# Patient Record
Sex: Female | Born: 1950 | Race: White | Hispanic: No | Marital: Married | Smoking: Never smoker
Health system: Southern US, Community
[De-identification: ages and names within clinical notes are randomized; demographics above are authoritative.]

## PROBLEM LIST (undated history)

## (undated) DIAGNOSIS — E7800 Pure hypercholesterolemia, unspecified: Secondary | ICD-10-CM

## (undated) DIAGNOSIS — I4892 Unspecified atrial flutter: Secondary | ICD-10-CM

## (undated) DIAGNOSIS — R0789 Other chest pain: Secondary | ICD-10-CM

## (undated) DIAGNOSIS — E785 Hyperlipidemia, unspecified: Secondary | ICD-10-CM

## (undated) DIAGNOSIS — R112 Nausea with vomiting, unspecified: Secondary | ICD-10-CM

## (undated) DIAGNOSIS — Z9889 Other specified postprocedural states: Secondary | ICD-10-CM

## (undated) HISTORY — PX: BREAST SURGERY: SHX581

## (undated) HISTORY — PX: TONSILLECTOMY: SUR1361

## (undated) HISTORY — DX: Other chest pain: R07.89

## (undated) HISTORY — PX: BREAST CYST EXCISION: SHX579

## (undated) HISTORY — PX: BREAST BIOPSY: SHX20

## (undated) HISTORY — DX: Unspecified atrial flutter: I48.92

## (undated) HISTORY — PX: ABDOMINAL HYSTERECTOMY: SHX81

## (undated) HISTORY — PX: CHOLECYSTECTOMY: SHX55

## (undated) HISTORY — DX: Hyperlipidemia, unspecified: E78.5

---

## 2000-09-14 ENCOUNTER — Other Ambulatory Visit: Admission: RE | Admit: 2000-09-14 | Discharge: 2000-09-14 | Payer: Self-pay | Admitting: *Deleted

## 2001-08-25 ENCOUNTER — Ambulatory Visit (HOSPITAL_COMMUNITY): Admission: RE | Admit: 2001-08-25 | Discharge: 2001-08-25 | Payer: Self-pay | Admitting: *Deleted

## 2001-08-25 ENCOUNTER — Encounter: Payer: Self-pay | Admitting: *Deleted

## 2003-07-19 ENCOUNTER — Ambulatory Visit (HOSPITAL_COMMUNITY): Admission: RE | Admit: 2003-07-19 | Discharge: 2003-07-19 | Payer: Self-pay | Admitting: General Surgery

## 2004-09-03 ENCOUNTER — Ambulatory Visit (HOSPITAL_COMMUNITY): Admission: RE | Admit: 2004-09-03 | Discharge: 2004-09-03 | Payer: Self-pay | Admitting: *Deleted

## 2005-06-23 ENCOUNTER — Observation Stay (HOSPITAL_COMMUNITY): Admission: EM | Admit: 2005-06-23 | Discharge: 2005-06-24 | Payer: Self-pay | Admitting: Emergency Medicine

## 2005-11-10 ENCOUNTER — Ambulatory Visit (HOSPITAL_COMMUNITY): Admission: RE | Admit: 2005-11-10 | Discharge: 2005-11-10 | Payer: Self-pay | Admitting: General Surgery

## 2006-06-30 ENCOUNTER — Ambulatory Visit: Payer: Self-pay | Admitting: Internal Medicine

## 2006-07-06 ENCOUNTER — Ambulatory Visit (HOSPITAL_COMMUNITY): Admission: RE | Admit: 2006-07-06 | Discharge: 2006-07-06 | Payer: Self-pay | Admitting: Internal Medicine

## 2006-07-06 ENCOUNTER — Encounter (INDEPENDENT_AMBULATORY_CARE_PROVIDER_SITE_OTHER): Payer: Self-pay | Admitting: *Deleted

## 2006-07-06 ENCOUNTER — Ambulatory Visit: Payer: Self-pay | Admitting: Internal Medicine

## 2007-05-06 ENCOUNTER — Other Ambulatory Visit: Admission: RE | Admit: 2007-05-06 | Discharge: 2007-05-06 | Payer: Self-pay | Admitting: Obstetrics and Gynecology

## 2007-10-14 ENCOUNTER — Ambulatory Visit (HOSPITAL_COMMUNITY): Admission: RE | Admit: 2007-10-14 | Discharge: 2007-10-14 | Payer: Self-pay | Admitting: Family Medicine

## 2007-10-18 ENCOUNTER — Ambulatory Visit (HOSPITAL_COMMUNITY): Admission: RE | Admit: 2007-10-18 | Discharge: 2007-10-18 | Payer: Self-pay | Admitting: Family Medicine

## 2009-01-29 ENCOUNTER — Ambulatory Visit (HOSPITAL_COMMUNITY): Admission: RE | Admit: 2009-01-29 | Discharge: 2009-01-29 | Payer: Self-pay | Admitting: General Surgery

## 2009-02-14 ENCOUNTER — Other Ambulatory Visit: Admission: RE | Admit: 2009-02-14 | Discharge: 2009-02-14 | Payer: Self-pay | Admitting: Obstetrics and Gynecology

## 2009-03-08 ENCOUNTER — Emergency Department (HOSPITAL_COMMUNITY): Admission: EM | Admit: 2009-03-08 | Discharge: 2009-03-08 | Payer: Self-pay | Admitting: Emergency Medicine

## 2009-06-19 ENCOUNTER — Ambulatory Visit (HOSPITAL_COMMUNITY): Admission: RE | Admit: 2009-06-19 | Discharge: 2009-06-19 | Payer: Self-pay | Admitting: Family Medicine

## 2010-06-07 ENCOUNTER — Emergency Department (HOSPITAL_COMMUNITY)
Admission: EM | Admit: 2010-06-07 | Discharge: 2010-06-07 | Disposition: A | Discharge status: Home / Self Care | Payer: BC Managed Care – PPO | Attending: Emergency Medicine | Admitting: Emergency Medicine

## 2010-06-07 DIAGNOSIS — Z859 Personal history of malignant neoplasm, unspecified: Secondary | ICD-10-CM | POA: Insufficient documentation

## 2010-06-07 DIAGNOSIS — R1013 Epigastric pain: Secondary | ICD-10-CM | POA: Insufficient documentation

## 2010-06-07 DIAGNOSIS — R000 Tachycardia, unspecified: Secondary | ICD-10-CM | POA: Insufficient documentation

## 2010-06-07 DIAGNOSIS — R112 Nausea with vomiting, unspecified: Secondary | ICD-10-CM | POA: Insufficient documentation

## 2010-06-07 DIAGNOSIS — R10816 Epigastric abdominal tenderness: Secondary | ICD-10-CM | POA: Insufficient documentation

## 2010-06-07 LAB — URINALYSIS, ROUTINE W REFLEX MICROSCOPIC
Glucose, UA: NEGATIVE mg/dL
Hgb urine dipstick: NEGATIVE
Protein, ur: NEGATIVE mg/dL
pH: 6 (ref 5.0–8.0)

## 2010-06-08 ENCOUNTER — Emergency Department (HOSPITAL_COMMUNITY): Payer: BC Managed Care – PPO

## 2010-06-08 ENCOUNTER — Inpatient Hospital Stay (HOSPITAL_COMMUNITY)
Admission: EM | Admit: 2010-06-08 | Discharge: 2010-06-12 | DRG: 494 | Disposition: A | Discharge status: Home Health | Payer: BC Managed Care – PPO | Attending: General Surgery | Admitting: General Surgery

## 2010-06-08 DIAGNOSIS — K8065 Calculus of gallbladder and bile duct with chronic cholecystitis with obstruction: Principal | ICD-10-CM | POA: Diagnosis present

## 2010-06-08 DIAGNOSIS — K8061 Calculus of gallbladder and bile duct with cholecystitis, unspecified, with obstruction: Principal | ICD-10-CM | POA: Diagnosis present

## 2010-06-08 LAB — DIFFERENTIAL
Lymphocytes Relative: 25 % (ref 12–46)
Lymphs Abs: 1.8 10*3/uL (ref 0.7–4.0)
Monocytes Relative: 9 % (ref 3–12)
Neutro Abs: 4.7 10*3/uL (ref 1.7–7.7)
Neutrophils Relative %: 65 % (ref 43–77)

## 2010-06-08 LAB — COMPREHENSIVE METABOLIC PANEL
Albumin: 3.8 g/dL (ref 3.5–5.2)
Alkaline Phosphatase: 147 U/L — ABNORMAL HIGH (ref 39–117)
BUN: 17 mg/dL (ref 6–23)
CO2: 28 mEq/L (ref 19–32)
Chloride: 105 mEq/L (ref 96–112)
Creatinine, Ser: 0.84 mg/dL (ref 0.4–1.2)
GFR calc non Af Amer: >60 mL/min (ref >60)
Potassium: 3.7 mEq/L (ref 3.5–5.1)
Total Bilirubin: 2.1 mg/dL — ABNORMAL HIGH (ref 0.3–1.2)

## 2010-06-08 LAB — CBC
HCT: 40.7 % (ref 36.0–46.0)
Hemoglobin: 13.5 g/dL (ref 12.0–15.0)
MCH: 29.8 pg (ref 26.0–34.0)
MCV: 89.8 fL (ref 78.0–100.0)
Platelets: 271 10*3/uL (ref 150–400)
RBC: 4.53 MIL/uL (ref 3.87–5.11)
WBC: 7.3 10*3/uL (ref 4.0–10.5)

## 2010-06-08 MED ORDER — IOHEXOL 300 MG/ML  SOLN
100.0000 mL | Freq: Once | INTRAMUSCULAR | Status: AC | PRN
  Administered 2010-06-08: 100 mL via INTRAVENOUS

## 2010-06-09 ENCOUNTER — Inpatient Hospital Stay (HOSPITAL_COMMUNITY): Payer: BC Managed Care – PPO

## 2010-06-09 DIAGNOSIS — K8040 Calculus of bile duct with cholecystitis, unspecified, without obstruction: Secondary | ICD-10-CM

## 2010-06-09 LAB — DIFFERENTIAL
Basophils Absolute: 0 10*3/uL (ref 0.0–0.1)
Basophils Relative: 1 % (ref 0–1)
Eosinophils Absolute: 0.1 10*3/uL (ref 0.0–0.7)
Monocytes Absolute: 0.5 10*3/uL (ref 0.1–1.0)
Monocytes Relative: 9 % (ref 3–12)
Neutro Abs: 2.9 10*3/uL (ref 1.7–7.7)
Neutrophils Relative %: 58 % (ref 43–77)

## 2010-06-09 LAB — CBC
MCH: 30.1 pg (ref 26.0–34.0)
MCHC: 33.4 g/dL (ref 30.0–36.0)
Platelets: 228 10*3/uL (ref 150–400)
RBC: 3.99 MIL/uL (ref 3.87–5.11)

## 2010-06-09 LAB — COMPREHENSIVE METABOLIC PANEL
ALT: 520 U/L — ABNORMAL HIGH (ref 0–35)
AST: 465 U/L — ABNORMAL HIGH (ref 0–37)
Albumin: 2.9 g/dL — ABNORMAL LOW (ref 3.5–5.2)
Calcium: 8.2 mg/dL — ABNORMAL LOW (ref 8.4–10.5)
Creatinine, Ser: 0.65 mg/dL (ref 0.4–1.2)
GFR calc Af Amer: >60 mL/min (ref >60)
Sodium: 137 mEq/L (ref 135–145)

## 2010-06-10 ENCOUNTER — Inpatient Hospital Stay (HOSPITAL_COMMUNITY): Payer: BC Managed Care – PPO

## 2010-06-10 DIAGNOSIS — K8000 Calculus of gallbladder with acute cholecystitis without obstruction: Secondary | ICD-10-CM

## 2010-06-10 LAB — HEPATIC FUNCTION PANEL
ALT: 820 U/L — ABNORMAL HIGH (ref 0–35)
AST: 504 U/L — ABNORMAL HIGH (ref 0–37)
Albumin: 3.5 g/dL (ref 3.5–5.2)
Alkaline Phosphatase: 202 U/L — ABNORMAL HIGH (ref 39–117)
Total Bilirubin: 1.7 mg/dL — ABNORMAL HIGH (ref 0.3–1.2)

## 2010-06-10 NOTE — H&P (Signed)
NAME:  Ruth Mendoza, Ruth Mendoza              ACCOUNT NO.:  [id number]  MEDICAL RECORD NO.:  [id number]           PATIENT TYPE:  I  LOCATION:  A306                          FACILITY:  [facility]  PHYSICIAN:  R. Roetta Sessions, M.D. DATE OF BIRTH:  December 25, 1950  DATE OF ADMISSION:  06/08/2010 DATE OF DISCHARGE:  LH                             HISTORY & PHYSICAL   REASON FOR CONSULTATION:  Cholecystitis with dilated biliary tree and markedly elevated bilirubin and aminotransferases.  HISTORY OF PRESENT ILLNESS:  [name] is a very pleasant 60- year-old lady who developed acute onset of intermittent nausea, vomiting, epigastric right upper quadrant abdominal pain 3 days ago. She presented to emergency department and was told she had a viral syndrome.  She was sent home.  Symptoms recurred and she returned and subsequently admitted to the hospital yesterday.  Evaluation upon admission demonstrated elevated LFTs with total bilirubin 2.1, fractionated alkaline phosphatase 147, AST and ALT 279 and 534.  Lipase normal at 45.  CBC revealed white count of 7.3, H and H 13.5 and 40.7.  She has been admitted to Dr. Daisy Blossom service.  Imaging has included a CT of the abdomen and pelvis as well as an ultrasound.  On CT, she has a mildly dilated intrahepatic and extrahepatic biliary tree with mild gallbladder wall thickening.  This was followed up with a ultrasound which demonstrated thickening of gallbladder wall, mild dilation of the bile duct and numerous small mobile gallstones.  No obvious choledocholithiasis on CT.  She has been treated with IV Rocephin. Today her LFTs are somewhat more elevated with a total bilirubin 3.5, alkaline phosphatase of 137, AST and ALT 465 and 520 respectively.  Albumin 2.9.  Today her abdominal pain is much improved.  She has not had any dark colored or clay colored stools.  She denies any similar symptoms previously.  No history of elevated liver  enzymes previously.  PAST MEDICAL HISTORY:  Significant for diarrhea, predominant irritable bowel syndrome.  She has seen Dr. Karilyn Cota previously, has been managed with Librax very nicely in the past.  Currently those chronic symptoms are quiescent.  She is no longer taking Librax.  She underwent screening colonoscopy by Dr. Karilyn Cota back in 2008.  She had some tiny erosions with histology revealing mild acute colitis and external hemorrhoids only.  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  History of multiple fiber cyst in both breasts removed by Dr. Malvin Johns previously.  CURRENT MEDICATIONS:  None.  ALLERGIES:  Multiple and she has intolerances to CODEINE and CONSCIOUS SEDATION with nausea and vomiting.  FAMILY HISTORY:  Negative for chronic GI or liver illness.  SOCIAL HISTORY:  The patient is married, has 3 children.  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 Rarely consumes alcohol.  No tobacco, no illicit drugs.  REVIEW OF SYSTEMS:  No doubt the patient has early satiety.  No melena, rectal bleeding, or change in weight recently.  No fever, chills. Otherwise as in history of present illness.  PHYSICAL EXAMINATION:  GENERAL:  A very pleasant 59-year lady resting comfortably in hospital room 306 accompanied by her husband Ruth Mendoza. VITAL SIGNS:  Temperature 97.8, pulse 69, respiratory rate 18, BP 97/57. SKIN:  Warm and dry.  There is no jaundice or cutaneous stigmata of chronic liver disease.  No scleral icterus. CHEST:  Lungs are clear to auscultation. HEART:  Regular rate and rhythm without murmur, gallop, or rub. BREASTS:  Deferred. ABDOMEN:  Nondistended.  Positive bowel sounds, minimal right upper quadrant tenderness to palpation.  No appreciable mass or organomegaly. EXTREMITIES:  No edema.  ADDITIONAL LABORATORY DATA:  Sodium 137,  potassium 3.5, chloride 107, CO2 23, glucose 93, BUN 10, creatinine 0.65.  CBC today, white count 5.1, H and H 12.0 and 35.9, MCV 90, platelet count 228,000.  IMPRESSION:  [name] is a very pleasant 60 year old lady admitted to hospital with acute cholecystitis along with biochemical and radiographic imaging consistent with partial biliary obstruction, likely secondary to choledocholithiasis.  Has no evidence of __________ pancreatitis, or cholangitis at this time.  Not mentioned above, she has been started on Rocephin.  I have discussed the case with Dr. Malvin Johns at length.  He plans to take her gallbladder out later in the week once her bile duct is cleared.  RECOMMENDATIONS:  Diagnostic/therapeutic ERCP likely with sphincterotomy and stone extraction tomorrow.  Discussed risks, benefits, limitations, alternatives, and imponderables with Ruth Mendoza.  Reviewed 1 in 10 Mendoza of pancreatitis, potential for reaction to medications, bleeding, and perforation.  All questions have been answered and all parties agreeable.  I will make further recommendations once ERCP has been performed.  We will arrange for tomorrow's dose of Rocephin given prior to the procedure.  I would thank Dr. Barbaraann Barthel for allowing to me to assist in this nice lady today.     Jonathon Bellows, M.D.     RMR/MEDQ  D:  06/09/2010  T:  06/09/2010  Job:  [id number]  cc:   Barbaraann Barthel, M.D. Fax: [phone]  Electronically Signed by Lorrin Goodell M.D. on 06/10/2010 10:31:16 AM

## 2010-06-11 ENCOUNTER — Other Ambulatory Visit: Payer: Self-pay | Admitting: General Surgery

## 2010-06-11 ENCOUNTER — Encounter (INDEPENDENT_AMBULATORY_CARE_PROVIDER_SITE_OTHER): Payer: Self-pay | Admitting: *Deleted

## 2010-06-11 LAB — HEPATIC FUNCTION PANEL
ALT: 488 U/L — ABNORMAL HIGH (ref 0–35)
Alkaline Phosphatase: 154 U/L — ABNORMAL HIGH (ref 39–117)
Bilirubin, Direct: 0.4 mg/dL — ABNORMAL HIGH (ref 0.0–0.3)
Indirect Bilirubin: 0.7 mg/dL (ref 0.3–0.9)
Total Bilirubin: 1.1 mg/dL (ref 0.3–1.2)
Total Protein: 5.6 g/dL — ABNORMAL LOW (ref 6.0–8.3)

## 2010-06-12 LAB — HEPATIC FUNCTION PANEL
ALT: 308 U/L — ABNORMAL HIGH (ref 0–35)
AST: 73 U/L — ABNORMAL HIGH (ref 0–37)
Albumin: 2.9 g/dL — ABNORMAL LOW (ref 3.5–5.2)
Alkaline Phosphatase: 125 U/L — ABNORMAL HIGH (ref 39–117)
Total Protein: 5.3 g/dL — ABNORMAL LOW (ref 6.0–8.3)

## 2010-06-12 LAB — DIFFERENTIAL
Basophils Absolute: 0 10*3/uL (ref 0.0–0.1)
Eosinophils Absolute: 0 10*3/uL (ref 0.0–0.7)
Eosinophils Relative: 1 % (ref 0–5)
Lymphocytes Relative: 26 % (ref 12–46)
Monocytes Absolute: 0.6 10*3/uL (ref 0.1–1.0)

## 2010-06-12 LAB — BASIC METABOLIC PANEL
Chloride: 110 mEq/L (ref 96–112)
GFR calc Af Amer: >60 mL/min (ref >60)
GFR calc non Af Amer: >60 mL/min (ref >60)
Potassium: 3.7 mEq/L (ref 3.5–5.1)
Sodium: 143 mEq/L (ref 135–145)

## 2010-06-12 LAB — CBC
HCT: 38.1 % (ref 36.0–46.0)
MCHC: 32.5 g/dL (ref 30.0–36.0)
RDW: 13.7 % (ref 11.5–15.5)

## 2010-06-16 NOTE — Op Note (Signed)
NAME:  Ruth Mendoza, Ruth Mendoza              ACCOUNT NO.:  [id number]  MEDICAL RECORD NO.:  [id number]           PATIENT TYPE:  I  LOCATION:  A306                          FACILITY:  [facility]  PHYSICIAN:  R. Roetta Sessions, M.D. DATE OF BIRTH:  07-Nov-1950  DATE OF PROCEDURE:  06/10/2010 DATE OF DISCHARGE:                              OPERATIVE REPORT   PROCEDURE PERFORMED:  ERCP with sphincterotomy stone (gravel extraction) via balloon dredging.  INDICATIONS FOR PROCEDURE:  A 60 year old lady admitted to hospital with acute cholecystitis, markedly elevated liver enzymes, mild dilation of her entire biliary tree high suggestive of choledocholithiasis.  She has been admitted to Dr. Malvin Johns.  Plans are for have laparoscopic cholecystectomy tomorrow.  I saw her over the weekend.  An ERCP now being done to clear her bile duct prior to laparoscopic cholecystectomy. I had a long discussion with the patient, and the patient's husband yesterday regarding this approach.  Risks, benefits, limitations, alternatives, imponderables have been discussed with Dr. Malvin Johns about potential for reaction to medications, bleeding, perforation, and at least 1 in 10 chance of pancreatitis.  All questions have been answered and all parties agreeable.  PROCEDURE NOTE:  She was placed under general endotracheal anesthesia by Dr. [facility].  She was put in the semiprone position. She received were Rocephin 1 g IV prior to the procedure.  Cetacaine spray for topical pharyngeal anesthesia.  INSTRUMENT:  Pentax video chip system.  FINDINGS:  The duodenoscope was easily passed through the esophagus, stomach, and into the second portion of the duodenum.  The distal esophagus across the examination of the stomach and duodenum revealed no abnormalities.  The ampulla of Vater was readily identified on the medial wall of the second portion of duodenum.  The scope was pulled back to the short position 55 cm from  incised scout film was taken.  Using the micro side of a sphincterotome, the ampulla was impacted. Using a guidewire, palpation almost immediately was achieved deep biliary cannulation with a wire.  The sphincterotome was followed up. Cholangiogram was obtained.  The extra and intra hepatic biliary tree was filled out nicely with the contrast as did the gallbladder. Gallbladder was loaded with stones and common bile duct was not dilated. I was unable to identify any filling defects.  Keeping safety wire deep in the biliary tree, the sphincter was pulled back across the ampullary orifice in the 12 o'clock position 1 cm sphincterotomy was performed, this was done without difficulty under barrier without apparent complication.  The sphincter tone was railed off keeping the safety wire in stable position.  Essentially gradually the balloon was railed to the confluence and partially inflated, a size bile duct and subsequently inclusion cholangiogram was obtained with this maneuver and with sphincterotomy was noted that there was quite a bit of dark viscous bilious fluid and some small dark specks and some tiny pieces of stone debris delivered through the ampullary orifice, but no larger stones were delivered.  There was excellent flow of amber colored clear fluid after this maneuver and there was excellent drainage seen of the biliary tree fluoroscopically.  The pancreatic  duct was not manipulated or injected.  The patient tolerated the procedure well and was taken PACU in stable condition.  IMPRESSION: 1. Normal ampulla. 2. Cholelithiasis, bile duct not dilated, status post sphincterotomy,     balloon dredging of the bile duct with recovery of viscous bile and     stone fragments (gravel) excellent drainage of the biliary tree     following this maneuver. 3. Pancreatic duct not injected or otherwise manipulated.  RECOMMENDATIONS: 1. Clear liquid diet today past midnight for  laparoscopic     cholecystectomy tomorrow per Dr. Malvin Johns. 2. Check hepatic function profile tomorrow morning. 3. Continue antibiotics.  I have discussed my findings with Dr. Malvin Johns, the patient's daughter, and called to the patient's husband at [phone] and reviewed the procedure findings and intervention.     Jonathon Bellows, M.D.     RMR/MEDQ  D:  06/10/2010  T:  06/10/2010  Job:  [id number]  cc:   Barbaraann Barthel, M.D. Fax: [phone]  [facility]  Electronically Signed by Lorrin Goodell M.D. on 06/16/2010 09:12:45 AM

## 2010-06-18 NOTE — Miscellaneous (Signed)
Summary: HISTORY & PHYSICAL  Clinical Lists Changes  NAME:  Ruth, Mendoza              ACCOUNT NO.:  [id number]      MEDICAL RECORD NO.:  [id number]           PATIENT TYPE:  I      LOCATION:  A306                          FACILITY:  [facility]      PHYSICIAN:  R. Roetta Sessions, M.D. DATE OF BIRTH:  1950-07-30      DATE OF ADMISSION:  06/08/2010   DATE OF DISCHARGE:  LH                                 HISTORY          REASON FOR CONSULTATION:  Cholecystitis with dilated biliary tree and   markedly elevated bilirubin and aminotransferases.      HISTORY OF PRESENT ILLNESS:  Ruth Mendoza is a very pleasant 7-   year-old lady who developed acute onset of intermittent nausea,   vomiting, epigastric right upper quadrant abdominal pain 3 days ago.   She presented to emergency department and was told she had a viral   syndrome.  She was sent home.  Symptoms recurred and she returned and   subsequently admitted to the hospital yesterday.      Evaluation upon admission demonstrated elevated LFTs with total   bilirubin 2.1, fractionated alkaline phosphatase 147, AST and ALT 279   and 534.  Lipase normal at 45.  CBC revealed white count of 7.3, H and H   13.5 and 40.7.      She has been admitted to Dr. Daisy Blossom service.      Imaging has included a CT of the abdomen and pelvis as well as an   ultrasound.  On CT, she has a mildly dilated intrahepatic and   extrahepatic biliary tree with mild gallbladder wall thickening.  This   was followed up with a ultrasound which demonstrated thickening of   gallbladder wall, mild dilation of the bile duct and numerous small   mobile gallstones.  No obvious choledocholithiasis on CT.  She has been   treated with IV Rocephin. Today her LFTs are somewhat more elevated with   a total bilirubin 3.5, alkaline phosphatase of 137, AST and ALT 465 and   520 respectively.  Albumin 2.9.  Today her abdominal pain is much   improved.      She has not had any  dark colored or clay colored stools.  She denies any   similar symptoms previously.  No history of elevated liver enzymes   previously.      PAST MEDICAL HISTORY:  Significant for diarrhea, predominant irritable   bowel syndrome.  She has seen Dr. Karilyn Cota previously, has been managed   with Librax very nicely in the past.  Currently those chronic symptoms   are quiescent.  She is no longer taking Librax.      She underwent screening colonoscopy by Dr. Karilyn Cota back in 2008.  She had   some tiny erosions with histology revealing mild acute colitis and   external hemorrhoids only.  History of D   grade dysplasia.      History of multiple fiber cyst in both breasts removed by Dr. Malvin Johns  previously.      CURRENT MEDICATIONS:  None.      ALLERGIES:  Multiple and she has intolerances to CODEINE and CONSCIOUS   SEDATION with nausea and vomiting.      FAMILY HISTORY:  Negative for chronic GI or liver illness.      SOCIAL HISTORY:  The patient is married, has 3 children.  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   Rarely consumes alcohol.  No tobacco, no illicit drugs.      REVIEW OF SYSTEMS:  No doubt the patient has early satiety.  No melena,   rectal bleeding, or change in weight recently.  No fever, chills.   Otherwise as in history of present illness.      PHYSICAL EXAMINATION:  GENERAL:  A very pleasant 59-year lady resting   comfortably in hospital room 306 accompanied by her husband Loistine Chance.   VITAL SIGNS:  Temperature 97.8, pulse 69, respiratory rate 18, BP 97/57.   SKIN:  Warm and dry.  There is no jaundice or cutaneous stigmata of   chronic liver disease.  No scleral icterus.   CHEST:  Lungs are clear to auscultation.   HEART:  Regular rate and rhythm without murmur, gallop, or rub.   BREASTS:  Deferred.   ABDOMEN:  Nondistended.  Positive bowel sounds,  minimal right upper   quadrant tenderness to palpation.  No appreciable mass or organomegaly.   EXTREMITIES:  No edema.      ADDITIONAL LABORATORY DATA:  Sodium 137, potassium 3.5, chloride 107,   CO2 23, glucose 93, BUN 10, creatinine 0.65.  CBC today, white count   5.1, H and H 12.0 and 35.9, MCV 90, platelet count 228,000.      IMPRESSION:  Ruth Mendoza is a very pleasant 60 year old lady   admitted to hospital with acute cholecystitis along with biochemical and   radiographic imaging consistent with partial biliary obstruction, likely   secondary to choledocholithiasis.  Has no evidence of __________   pancreatitis, or cholangitis at this time.  Not mentioned above, she has   been started on Rocephin.      I have discussed the case with Dr. Malvin Johns at length.      He plans to take her gallbladder out later in the week once her bile   duct is cleared.      RECOMMENDATIONS:  Diagnostic/therapeutic ERCP likely with sphincterotomy   and stone extraction tomorrow.  Discussed risks, benefits, limitations,   alternatives, and imponderables with Mr. and Ms. Freeman.  Reviewed 1 in   10 chance of pancreatitis, potential for reaction to medications,   bleeding, and perforation.  All questions have been   answered and all parties agreeable.  I will make further recommendations   once ERCP has been performed.  We will arrange for tomorrow's dose of   Rocephin given prior to the procedure.      I would thank Dr. Barbaraann Barthel for allowing to me to assist in this   nice lady today.               Jonathon Bellows, M.D.               RMR/MEDQ  D:  06/09/2010  T:  06/09/2010  Job:  [id number]      cc:   Barbaraann Barthel, M.D.   Fax: [phone]  Electronically Signed by Lorrin Goodell M.D. on 06/10/2010 10:31:16 AM

## 2010-06-24 NOTE — Discharge Summary (Signed)
NAME:  Ruth Mendoza, EGLE NO.:  [id number]  MEDICAL RECORD NO.:  [id number]           PATIENT TYPE:  I  LOCATION:  A306                          FACILITY:  [facility]  PHYSICIAN:  Barbaraann Barthel, M.D. DATE OF BIRTH:  July 28, 1950  DATE OF ADMISSION:  06/08/2010 DATE OF DISCHARGE:  03/14/2012LH                              DISCHARGE SUMMARY   DIAGNOSES:  Cholecystitis, cholelithiasis, choledocholithiasis.  CONSULTATIONS:  GI Service (Dr. Jena Gauss).  PROCEDURE:  On June 10, 2010, ERCP by Dr. Jena Gauss on June 11, 2010, laparoscopic cholecystectomy, Dr. Malvin Johns.  NOTE:  This is a 60 year old white female who had her first episode of biliary colic.  She was seen in the emergency room and later returned when her symptoms recurred and workup subsequently showed her to have cholecystitis with cholelithiasis and questionable choledocholithiasis. She had elevated liver function studies and bilirubin suggesting common bile duct stone.  I consulted Dr. Jena Gauss, who agreed with this and proceeded with ERCP which was uneventful.  She underwent an ERCP and a sphincterotomy and at that time on June 11, 2010, and then on the following 24 hours, performed laparoscopic cholecystectomy uneventfully. Postoperatively, her course was completely smooth.  She had minimal JP drainage.  The following day, she was tolerating p.o. well.  She was afebrile.  She had no dysuria, leg pain, or shortness of breath.  Her wounds were clean and her bilirubin was normal and her liver function studies were also on a steady downward trend and almost within the normal range.  Postoperatively, there were no problems.  She was then discharged after removing her Jackson-Pratt drain and changing her dressings and discharge instructions were given to her. Her laboratory data while in the hospital is stated, she had a CT scan which showed cholecystitis and dilated biliary radicals followed by sonogram which confirmed  cholelithiasis and her liver function studies on admission were quite elevated.  She had a bilirubin at 2.1, this later went to 3.5 and AST of 279, which later went to 465, ALT 534 which remained in the 500 range as well lipase, however was not elevated.  On admission, her white count was 5.1 with an H and H of 12.0 and 35.9. Postoperatively, her liver function studies all went down, bilirubin after ERCP was 1.1, and it continued to downward trend as all of her liver function studies did.  DISCHARGE INSTRUCTIONS:  She is discharged on Tylenol Extra Strength for pain.  She has great intolerance to any CODEINE as they make her very nauseated. Activity, she is told to increase it as tolerated.  She is permitted to go up and down the stairs.  She is admitted to shower.  She is told to do no heavy lifting, no driving and no sex and until she feels better and we will discuss that with her.  I will follow up with her perioperatively.  She is discharged on a full liquid and soft diet, told to clean her wound with alcohol a couple of times a day as needed and to change her drain site as well as needed.  She is told to follow up with  me on Wednesday, June 19, 2010, at 10 a.m., and she is given my phone number to call should she have any acute problems and she is told to go to the emergency room, she can reach me if she has any problems.     Barbaraann Barthel, M.D.     WB/MEDQ  D:  06/12/2010  T:  06/12/2010  Job:  [id number]  cc:   R. Roetta Sessions, M.D. [address] [place] [address]  Dr. Phillips Odor  Electronically Signed by Barbaraann Barthel M.D. on 06/24/2010 11:42:08 AM

## 2010-06-24 NOTE — Op Note (Signed)
NAME:  Ruth Mendoza, Ruth Mendoza NO.:  [id number]  MEDICAL RECORD NO.:  [id number]           PATIENT TYPE:  I  LOCATION:  A306                          FACILITY:  [facility]  PHYSICIAN:  Barbaraann Barthel, M.D. DATE OF BIRTH:  22-Oct-1950  DATE OF PROCEDURE:  06/11/2010 DATE OF DISCHARGE:                              OPERATIVE REPORT   DIAGNOSES:  Cholecystitis, cholelithiasis, choledocholithiasis, status post endoscopic retrograde cholangiopancreatography.  PROCEDURE:  Laparoscopic cholecystectomy.  No cholangiogram.  WOUND CLASSIFICATION:  Clean and contaminated.  SPECIMEN:  Gallbladder and stones.  NOTE:  This is a 60 year old white female who had her first episode of gallbladder symptoms and was seen in the emergency room, initially thought to have a viral syndrome and later returned when she was found to have cholecystitis and cholelithiasis and dilated biliary radicals and elevated liver function studies.  This suggested the possibility choledocholithiasis in this patient and an ERCP was performed after a GI consultation.  This was uneventful and 24 hours later she was scheduled for a laparoscopic cholecystectomy.  We discussed complications not limited to but including bleeding, infection, damage to bile ducts, perforation of organs, transitory diarrhea, and the possibility that an open cholecystectomy might be performed.  Informed consent was obtained.  GROSS OPERATIVE FINDINGS:  Residual edema of the gallbladder.  She had a small cystic duct which was not cannulated.  She had BB-sized stones within the gallbladder, otherwise the right upper quadrant was grossly within normal limits.  TECHNIQUE:  The patient was placed in supine position.  After the adequate administration of general anesthesia via endotracheal intubation, her entire abdomen was prepped with Betadine solution and draped in the usual manner.  Prior to this, a Foley catheter was aseptically  inserted.  A periumbilical incision was carried out over the superior aspect of the umbilicus.  A fascia was grasped and elevated, and a Veress needle was inserted and confirmed position with a saline drop test.  The abdomen was then insufflated with about approximately 3.7 liters of CO2 and then using the Visiport technique an 11-mm cannula was placed in the umbilicus incision.  Then under direct vision, another 11-mm cannula was placed in the epigastrium and two 5-mm cannulas in the right upper quadrant laterally.  The gallbladder was grasped.  Adhesions were taken down.  The cystic duct was clearly visualized.  It was triply silver clipped on the side of the common bile duct and singly silver clipped on the side of the gallbladder and the cystic duct was divided and likewise the cystic artery was triply silver clipped and divided and the gallbladder was removed from the liver bed using the hook cautery device without any spillage.  We then irrigated with normal saline solution.  I elected to leave Jackson-Pratt drain in the liver bed as well as Surgicel.  The drain exited from one of the lateral port sites. After checking for hemostasis, we then desufflated the abdomen and then I closed the 11-mm cannula sites in the epigastrium and the umbilicus incisions with 0 Polysorb and used 0.5% Sensorcaine at all port sites for postoperative comfort.  We then closed the  skin with a stapling device.  Prior to closure, the drain was sutured with 3-0 nylon and all sponge, needle, and instrument counts were found to be correct. The sterile dressings were applied.  The patient was taken to the recovery room in satisfactory condition.     Barbaraann Barthel, M.D.     WB/MEDQ  D:  06/11/2010  T:  06/12/2010  Job:  [id number]  cc:   R. Roetta Sessions, M.D. [address] [place] [address]  Corrie Mckusick, M.D. Fax: [phone]  Electronically Signed by Barbaraann Barthel M.D. on 06/24/2010  11:46:02 AM

## 2010-06-24 NOTE — Consult Note (Signed)
NAME:  Ruth Mendoza, Ruth Mendoza NO.:  [id number]  MEDICAL RECORD NO.:  [id number]           PATIENT TYPE:  I  LOCATION:  A306                          FACILITY:  [facility]  PHYSICIAN:  Barbaraann Barthel, M.D. DATE OF BIRTH:  11/28/1950  DATE OF CONSULTATION:  06/09/2010 DATE OF DISCHARGE:                                CONSULTATION   Note, Surgery was asked to see this 60 year old white female through the emergency room for abdominal pain.  CHIEF COMPLAINT:  Epigastric pain with nausea and vomiting.  HISTORY OF PRESENT MEDICAL ILLNESS:  The patient states that she went to the emergency room last Thursday when she had similar symptoms of epigastric pain and nausea and vomiting.  She was thought to have a viral syndrome and discharged.  She returned several days later with continuing onset of pain.  In the interim, she had placed herself on a bland diet and she felt somewhat better, however, she continued to have discomfort.  She had a CT scan done in the emergency room that showed some thickening of her gallbladder and suggestions of cholecystitis.  No stones were seen on the CT scan.  Surgery was consulted.  The patient was admitted.  Sonogram this morning revealed that she had multiple small stones within the gallbladder.  Also, there was some minimally dilated common bile duct and intrahepatic radicals and looking at her liver function studies these had shown an upward trend in the last 24 hours.  Her bilirubin had gone from 2.1-3.5 and her AST and ALT went from 279-465 and stayed in the 500 range respectively.  Her lipase remained within normal limits and was not elevated, today went from 45- 33 units.  PHYSICAL EXAMINATION:  GENERAL:  A pleasant 60 year old white female who is in no acute distress. GENERAL:  She is 5 feet and 10 inches, weighs 200 pounds.  Her temperature is 97.8.  Her blood pressure is 97/57.  Her heart rate is 69.  Her respirations are 18.  Her O2  sat is 93%. HEENT:  Head is normocephalic.  Eyes, extraocular movements are intact. Pupils are round and react to light and accommodation.  There is no conjunctive pallor or scleral injection.  The sclera is nonicteric.  Her nose and oral mucosa are moist. NECK:  Supple and cylindrical without jugular vein distention, thyromegaly, tracheal deviation, or cervical adenopathy.  No bruits are auscultated. CHEST:  Clear both anterior and posterior to auscultation. HEART:  Regular rhythm. BREASTS AND AXILLA:  Without masses.  She has some nipple inversion bilaterally, but no masses and no other findings. ABDOMEN:  Soft.  The patient has most pain in the epigastrium on deep palpation.  No rebound tenderness and no Murphy sign and no masses are appreciated.  She has no visceromegaly and no hernias.  She has had a previous transverse incision, Pfannenstiel incision for C-section. There is no hernia. RECTAL:  Stool is guaiac-negative. EXTREMITIES:  Within normal limits without varicosities or edema.  REVIEW OF SYSTEMS:  GI:  Nausea and vomiting and epigastric discomfort. This is the first episode of these symptoms which occurred after shrimp meal last  Thursday.  She does have diagnosis of having irritable bowel syndrome.  She underwent a colonoscopy in 2009.  She has no past history of hepatitis.  No past history of bright red rectal bleeding, black- tarry stools, or unexplained weight loss.  No past history of inflammatory bowel disease.  GU:  No history of nephrolithiasis or dysuria.  ENDOCRINE:  No history of diabetes or thyroid disease. MUSCULOSKELETAL:  Grossly within normal limits.  The patient is slightly overweight.  CARDIORESPIRATORY:  Within normal limits.  The patient is a nonsmoker and only an occasional social drinker.  NEURO:  Within normal limits.  MEDICATIONS:  The patient takes no medication on a regular basis.  She has some intolerance to CODEINE which makes her  nauseated.  LABORATORY DATA:  As mentioned above, her white count is 5.1 with an H and H of 12.0 and 35.9 with no neutrophilia.  CT scan and sonogram as mentioned above.  IMPRESSION:  Cholecystitis and possible choledocholithiasis.  PLAN:  We will continue on antibiotics and hydration and clear liquids with plans for her to see the gastroenterologist for possible ERCP to precede laparoscopic cholecystectomy.  We discussed the surgery in detail with the patient discussing complications not limited to but including bleeding, infection, damage to bile ducts, perforation of organs, and transitory diarrhea.  Informed consent was obtained.     Barbaraann Barthel, M.D.     WB/MEDQ  D:  06/09/2010  T:  06/10/2010  Job:  [id number]  cc:   R. Roetta Sessions, M.D. [address] [place] [address]  Electronically Signed by Barbaraann Barthel M.D. on 06/24/2010 11:45:18 AM

## 2010-08-16 NOTE — Cardiovascular Report (Signed)
NAME:  Ruth Mendoza, MCGILLIS NO.:  [id number]   MEDICAL RECORD NO.:  [id number]          PATIENT TYPE:  INP   LOCATION:  3704                         FACILITY:  [facility]   PHYSICIAN:  Nicki Guadalajara, M.D.     DATE OF BIRTH:  11-04-1950   DATE OF PROCEDURE:  06/24/2005  DATE OF DISCHARGE:  06/24/2005                              CARDIAC CATHETERIZATION   INDICATIONS:  Ms. Fabianna Keats is a very pleasant 60 year old female who  was admitted to [facility] with episode of chest  tightness/pressure.  The patient has noticed several weeks of vague chest  ache.  Yesterday, she presented Dr. Lamar Blinks office.  Her chest pain was  relieved with sublingual nitroglycerin.  She was admitted to [facility].  Lipid panel was notable for a LDL of 144, total cholesterol of  212.  Catheterization was recommended.   PROCEDURE:  After premedication with Versed 2 mg intravenously, the patient  was prepped and draped in the usual fashion.  Her right femoral artery was  punctured anteriorly and a 5-French arterial sheath was inserted.  Diagnostic catheterization was done utilizing 6-French Cordis Judkins #4  left and right coronary catheters.  A pigtail catheter was used for a single-  plane RAO ventriculography.  At the start of the procedure, the patient did  become slightly nauseated.  She was given 4 mg of Zofran intravenously with  resolution.  She tolerated the procedure well.  She returned to room in  satisfactory condition.   HEMODYNAMIC DATA:  Central aortic pressure was 101/64.  Left ventricular  pressure is 101/3.  Post A-wave 16.   ANGIOGRAPHIC DATA:  Left main coronary was a short normal vessel which  bifurcated into LAD and a dominant left circumflex coronary artery.   The LAD was angiographically normal.  It gave rise to several proximal  diagonal vessels, several septal perforating arteries and also one major mid-  diagonal vessel.  The LAD and its branches  were angiographically normal.   The circumflex vessel was angiographically normal.  It gave rise to three  marginal vessels and a small PLA, inferior LV branch vessel.   Right coronary artery was angiographically normal.  It gave rise to a small  PDA like system.   RAO ventriculography revealed normal LV contractility without focal  segmental wall motion abnormalities.  There was no evidence for mitral valve  prolapse.  Ejection fraction is approximately 60%.   IMPRESSION:  1.  Normal left ventricular function.  2.  Normal coronary arteries.           ______________________________  Nicki Guadalajara, M.D.     TK/MEDQ  D:  06/24/2005  T:  06/25/2005  Job:  [id number]   cc:   Dani Gobble, MD  Fax: [phone]   Corrie Mckusick, M.D.  Fax: [phone]   Orville Govern

## 2010-08-16 NOTE — Discharge Summary (Signed)
NAME:  Ruth Mendoza, Ruth Mendoza NO.:  [id number]   MEDICAL RECORD NO.:  [id number]          PATIENT TYPE:  INP   LOCATION:  3704                         FACILITY:  [facility]   PHYSICIAN:  Dani Gobble, MD       DATE OF BIRTH:  12-Dec-1950   DATE OF ADMISSION:  06/23/2005  DATE OF DISCHARGE:  06/24/2005                                 DISCHARGE SUMMARY   DISCHARGE DIAGNOSES:  1.  Chest pain consistent with unstable angina, normal coronaries at      catheterization this admission.  2.  Dyslipidemia with LDL of 144.  3.  History of irritable bowel syndrome.  4.  Family history of coronary disease.   HOSPITAL COURSE:  The patient is 60 year old female with the family history  of coronary disease who presented with chest pain consistent with unstable  angina.  Please see Dr. Roque Lias note for complete details.  The patient  was admitted to [facility] and set up for catheterization.  She was  put on heparin and nitrates, Statin, beta-blocker.  Catheterization was done  June 24, 2005.  This revealed normal coronaries and normal LV function.  Dr. Tresa Endo has started her on a Statin.  We will go ahead and stop her  aspirin and beta blocker and have her take Prilosec over-the-counter once a  day.  She has been instructed to not work until Monday.  We will see her  back in [place].   DISCHARGE MEDICATIONS:  1.  Simvastatin 20 mg a day.  2.  Prilosec OTC q.d.   LABS:  A CK-MB and troponins were negative x2.  Cholesterol 212, LDL 144,  HDL 43, hemoglobin A1c 5.6.  TSH 2.67, calcium 8.4, magnesium 2.1.  INR 1.0.  Sodium 135, potassium 3.6, BUN 22, creatinine 0.9.  White count  8.0, hemoglobin 12.9, hematocrit 37.5, platelets 250.  Liver functions were  normal.  EKG shows sinus rhythm without acute changes.   DISPOSITION:  The patient was discharged in stable condition and will follow-  up with Dr. Domingo Sep.      Abelino Derrick, P.A.    ______________________________  Dani Gobble, MD    LKK/MEDQ  D:  06/24/2005  T:  06/25/2005  Job:  [id number]   cc:   Dani Gobble, MD  Fax: [phone]   Corrie Mckusick, M.D.  Fax: [phone]

## 2010-08-16 NOTE — H&P (Signed)
NAME:  Ruth Mendoza, Ruth Mendoza              ACCOUNT NO.:  [id number]   MEDICAL RECORD NO.:  [id number]          PATIENT TYPE:  AMB   LOCATION:                                FACILITY:  [facility]   PHYSICIAN:  Lionel December, M.D.    DATE OF BIRTH:  1950/05/13   DATE OF ADMISSION:  DATE OF DISCHARGE:  LH                              HISTORY & PHYSICAL   CHIEF COMPLAINT:  IBS, need refills, schedule for colonoscopy.   HISTORY OF PRESENT ILLNESS:  Ruth Mendoza is a 60 year old, Caucasian  female who has a history of IBS, who presents for follow up in need of a  refill and schedule colonoscopy; we last saw her in 2002.  She states  she does very well.  Once or twice a month she has a flair with diarrhea  and she takes Librax with good results.  Otherwise, she denies any  abdominal pain, nausea or vomiting, unintentional weight loss, melena or  rectal bleeding.  She started weight watchers about 3 weeks' ago and has  had increased dual frequency with a change in diet.  She is less about  11 pounds.   CURRENT MEDICATIONS:  Hydroxy cut one daily   ALLERGIES:  No known drug allergies   PAST MEDICAL HISTORY:  1. IBS.  2. History of uterine dysplasia in the remote past treated with a D&C      at the time.  She had a successful pregnancy after that, and      eventually underwent a hysterectomy with repair of cystocele and      rectocele for unrelated reasons.  3. She also had to benign lumps removed from her breast.  4. She is headed tonsillectomy.   FAMILY HISTORY:  Grandmother deceased due to stomach cancer age 32.  No  family history of colorectal cancer.   SOCIAL HISTORY:  She is married and has three children.  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  She has never been a smoker.  She occasionally consumes  alcohol.   REVIEW OF SYSTEMS:  See HPI for GI and Constitutional.  CARDIOPULMONARY:  No chest pain or shortness of breath.   PHYSICAL  EXAMINATION:  VITAL SIGNS:  Weight 213/5.  Height 5 feet 11  inches.  Temperature 97.7, blood pressure 102/76, pulse 68.  GENERAL:  A pleasant, well-nourished, well-developed, Caucasian female  in no acute distress.  SKIN:  Warm and dry.  No jaundice.  HEENT:  Sclerae anicteric.  Oropharyngeal mucosa moist and pink.  No  lesions, erythema, or exudate.  NECK:  No lymphadenopathy or thyromegaly.  CHEST:  Lungs are clear to auscultation.  CARDIAC EXAM:  Reveals regular rate and rhythm.  Normal S1, S2.  No  murmurs, rubs, or gallops.  ABDOMEN:  Positive bowel sounds.  Abdomen is soft, nontender,  nondistended; no organomegaly or masses.  No rebound tenderness or  guarding.  No abdominal bruits or hernias.  EXTREMITIES:  No edema.   IMPRESSION:  Ruth Mendoza is a 60 year old lady with a  long history of  irritable bowel syndrome, and does well with Librax intermittently.  She  is never had a colonoscopy and wishes to schedule 1 at this time.  She  is very concerned about developing nausea and vomiting with sedation.  She states that even with conscious sedation, before and premeditated  with antibiotics.  She started developing vomiting.  She requests  attempt at colonoscopy without sedation, initially.   PLAN:  1. Colonoscopy in the new future with Dr. Karilyn Cota.  The patient was      given Visicol prep per her request.  2. Will make Dr. Karilyn Cota aware of the patient's reactions to conscious      sedation.  Will consider a trial of Zofran or Phenergan prior to      procedure.  The patient requests undergoing colonoscopy without      sedation, unless she becomes uncomfortable during study or during      procedure.  3. Librax #120 to take one before meals and at bedtime as needed for      abdominal cramps and diarrhea.  Refill is given.      Tana Coast, P.A.      Lionel December, M.D.  Electronically Signed    LL/MEDQ  D:  06/30/2006  T:  06/30/2006  Job:  [id number]

## 2010-08-16 NOTE — Op Note (Signed)
NAME:  KAMBREA, CARRASCO              ACCOUNT NO.:  [id number]   MEDICAL RECORD NO.:  [id number]          PATIENT TYPE:  AMB   LOCATION:  DAY                           FACILITY:  [facility]   PHYSICIAN:  Lionel December, M.D.    DATE OF BIRTH:  [birth year]   DATE OF PROCEDURE:  07/06/2006  DATE OF DISCHARGE:                               OPERATIVE REPORT   PROCEDURE:  Colonoscopy.   INDICATIONS:  Ruth Mendoza is a 60 year old Caucasian female who is undergoing  screening colonoscopy.  The procedure risks were reviewed with the  patient, and informed consent was obtained.   MEDICATIONS FOR CONSCIOUS SEDATION:  Demerol 50 mg IV, Versed 5 mg IV.   FINDINGS:  The procedure performed in endoscopy suite.  The patient's  vital signs and O2 saturations were monitored during procedure and  remained stable.  The patient was placed in the left lateral position  and rectal examination performed.  No abnormality noted on external or  digital exam.  The Pentax videoscope was placed in the rectum and  advanced under vision into the sigmoid colon and beyond.  She had  multiple small scattered diverticula at the sigmoid colon.  Biopsy was  obtained on the way out.  The scope was passed into the proximal colon  and beyond.  Redundant hepatic flexure with a lot of stool in this area  requiring vigorous washing.  The scope was finally passed beyond this  area into the cecum which was identified by the appendiceal orifice and  ileocecal valve.  Pictures were taken for the record.  A short segment  of TI was also examined and was normal.  Once again, pictures were taken  for the record.  As the scope was withdrawn, the colonic mucosa was  carefully examined and was normal throughout, except at sigmoid colon  revealing scattered punctate erosions.  Biopsy was taken for routine  histology.  The rectal mucosa was normal.  The scope was retroflexed to  examine the anorectal junction, and small hemorrhoids were noted below  the  dentate line.  The endoscope was straightened and withdrawn.  The  patient tolerated the procedure well.   FINAL DIAGNOSIS:  1. Normal terminal ileum.  2. Scattered punctate erosions at sigmoid colon; otherwise, normal      colonoscopy.  Biopsy taken from this area.  3. External hemorrhoids.   RECOMMENDATIONS:  She will resume her usual medications and high-fiber  diet, and she uses Librax on a p.r.n. basis.   I will be contacting the patient with results of the biopsy and further  recommendations.      Lionel December, M.D.  Electronically Signed     NR/MEDQ  D:  07/06/2006  T:  07/06/2006  Job:  [id number]   cc:   Patrica Duel, M.D.  Fax: [phone]

## 2011-02-28 ENCOUNTER — Other Ambulatory Visit: Payer: Self-pay | Admitting: Obstetrics and Gynecology

## 2011-02-28 DIAGNOSIS — Z139 Encounter for screening, unspecified: Secondary | ICD-10-CM

## 2011-03-13 ENCOUNTER — Ambulatory Visit (HOSPITAL_COMMUNITY)
Admission: RE | Admit: 2011-03-13 | Discharge: 2011-03-13 | Disposition: A | Discharge status: Home / Self Care | Payer: BC Managed Care – PPO | Source: Ambulatory Visit | Attending: Obstetrics and Gynecology | Admitting: Obstetrics and Gynecology

## 2011-03-13 DIAGNOSIS — Z139 Encounter for screening, unspecified: Secondary | ICD-10-CM

## 2011-03-13 DIAGNOSIS — Z1231 Encounter for screening mammogram for malignant neoplasm of breast: Secondary | ICD-10-CM | POA: Insufficient documentation

## 2011-04-10 ENCOUNTER — Other Ambulatory Visit: Payer: Self-pay | Admitting: Adult Health

## 2011-04-10 ENCOUNTER — Other Ambulatory Visit (HOSPITAL_COMMUNITY)
Admission: RE | Admit: 2011-04-10 | Discharge: 2011-04-10 | Disposition: A | Discharge status: Home / Self Care | Payer: BC Managed Care – PPO | Source: Ambulatory Visit | Attending: Obstetrics and Gynecology | Admitting: Obstetrics and Gynecology

## 2011-04-10 DIAGNOSIS — Z01419 Encounter for gynecological examination (general) (routine) without abnormal findings: Secondary | ICD-10-CM | POA: Insufficient documentation

## 2012-10-15 ENCOUNTER — Other Ambulatory Visit: Payer: Self-pay | Admitting: Adult Health

## 2012-10-15 DIAGNOSIS — Z139 Encounter for screening, unspecified: Secondary | ICD-10-CM

## 2012-10-18 ENCOUNTER — Ambulatory Visit (HOSPITAL_COMMUNITY)
Admission: RE | Admit: 2012-10-18 | Discharge: 2012-10-18 | Disposition: A | Discharge status: Home / Self Care | Payer: BC Managed Care – PPO | Source: Ambulatory Visit | Attending: Adult Health | Admitting: Adult Health

## 2012-10-18 DIAGNOSIS — Z1231 Encounter for screening mammogram for malignant neoplasm of breast: Secondary | ICD-10-CM | POA: Insufficient documentation

## 2012-10-18 DIAGNOSIS — Z139 Encounter for screening, unspecified: Secondary | ICD-10-CM

## 2012-10-19 ENCOUNTER — Ambulatory Visit (HOSPITAL_COMMUNITY): Payer: BC Managed Care – PPO

## 2013-07-15 DIAGNOSIS — Z029 Encounter for administrative examinations, unspecified: Secondary | ICD-10-CM

## 2013-09-19 ENCOUNTER — Other Ambulatory Visit (HOSPITAL_COMMUNITY): Payer: Self-pay | Admitting: Family Medicine

## 2013-09-19 ENCOUNTER — Ambulatory Visit (HOSPITAL_COMMUNITY)
Admission: RE | Admit: 2013-09-19 | Discharge: 2013-09-19 | Disposition: A | Discharge status: Home / Self Care | Payer: BC Managed Care – PPO | Source: Ambulatory Visit | Attending: Family Medicine | Admitting: Family Medicine

## 2013-09-19 DIAGNOSIS — R0602 Shortness of breath: Secondary | ICD-10-CM | POA: Insufficient documentation

## 2013-09-19 DIAGNOSIS — R079 Chest pain, unspecified: Secondary | ICD-10-CM

## 2013-09-20 ENCOUNTER — Other Ambulatory Visit (HOSPITAL_COMMUNITY): Payer: Self-pay | Admitting: Family Medicine

## 2013-09-20 DIAGNOSIS — Z1231 Encounter for screening mammogram for malignant neoplasm of breast: Secondary | ICD-10-CM

## 2013-10-19 ENCOUNTER — Ambulatory Visit (HOSPITAL_COMMUNITY): Payer: BC Managed Care – PPO

## 2013-10-19 ENCOUNTER — Encounter (HOSPITAL_COMMUNITY): Payer: Self-pay | Admitting: Emergency Medicine

## 2013-10-19 ENCOUNTER — Emergency Department (HOSPITAL_COMMUNITY): Payer: BC Managed Care – PPO

## 2013-10-19 ENCOUNTER — Emergency Department (HOSPITAL_COMMUNITY)
Admission: EM | Admit: 2013-10-19 | Discharge: 2013-10-19 | Disposition: A | Discharge status: Home / Self Care | Payer: BC Managed Care – PPO | Attending: Emergency Medicine | Admitting: Emergency Medicine

## 2013-10-19 DIAGNOSIS — S99919A Unspecified injury of unspecified ankle, initial encounter: Principal | ICD-10-CM

## 2013-10-19 DIAGNOSIS — S7001XA Contusion of right hip, initial encounter: Secondary | ICD-10-CM

## 2013-10-19 DIAGNOSIS — W010XXA Fall on same level from slipping, tripping and stumbling without subsequent striking against object, initial encounter: Secondary | ICD-10-CM | POA: Insufficient documentation

## 2013-10-19 DIAGNOSIS — Y9229 Other specified public building as the place of occurrence of the external cause: Secondary | ICD-10-CM | POA: Insufficient documentation

## 2013-10-19 DIAGNOSIS — S7000XA Contusion of unspecified hip, initial encounter: Secondary | ICD-10-CM | POA: Insufficient documentation

## 2013-10-19 DIAGNOSIS — M25562 Pain in left knee: Secondary | ICD-10-CM

## 2013-10-19 DIAGNOSIS — Y9389 Activity, other specified: Secondary | ICD-10-CM | POA: Insufficient documentation

## 2013-10-19 DIAGNOSIS — Z7982 Long term (current) use of aspirin: Secondary | ICD-10-CM | POA: Insufficient documentation

## 2013-10-19 DIAGNOSIS — S99929A Unspecified injury of unspecified foot, initial encounter: Principal | ICD-10-CM

## 2013-10-19 DIAGNOSIS — S8990XA Unspecified injury of unspecified lower leg, initial encounter: Secondary | ICD-10-CM | POA: Insufficient documentation

## 2013-10-19 MED ORDER — IBUPROFEN 800 MG PO TABS: 800.0000 mg | ORAL_TABLET | Freq: Three times a day (TID) | ORAL | Status: DC

## 2013-10-19 MED ORDER — IBUPROFEN 800 MG PO TABS
800.0000 mg | ORAL_TABLET | Freq: Once | ORAL | Status: AC
  Administered 2013-10-19: 800 mg via ORAL
  Filled 2013-10-19: qty 1

## 2013-10-19 NOTE — ED Notes (Signed)
Fell today at [employer], pain in right hip and left knee

## 2013-10-19 NOTE — Discharge Instructions (Signed)
Contusion °A contusion is a deep bruise. Contusions happen when an injury causes bleeding under the skin. Signs of bruising include pain, puffiness (swelling), and discolored skin. The contusion may turn blue, purple, or yellow. °HOME CARE  °· Put ice on the injured area. °¨ Put ice in a plastic bag. °¨ Place a towel between your skin and the bag. °¨ Leave the ice on for 15-20 minutes, 03-04 times a day. °· Only take medicine as told by your doctor. °· Rest the injured area. °· If possible, raise (elevate) the injured area to lessen puffiness. °GET HELP RIGHT AWAY IF:  °· You have more bruising or puffiness. °· You have pain that is getting worse. °· Your puffiness or pain is not helped by medicine. °MAKE SURE YOU:  °· Understand these instructions. °· Will watch your condition. °· Will get help right away if you are not doing well or get worse. °Document Released: 09/03/2007 Document Revised: 06/09/2011 Document Reviewed: 01/20/2011 °ExitCare® Patient Information ©2015 [employer]. This information is not intended to replace advice given to you by your health care provider. Make sure you discuss any questions you have with your health care provider. ° °

## 2013-10-21 NOTE — ED Provider Notes (Signed)
CSN: [id number]     Arrival date & time 10/19/13  1638 History   First MD Initiated Contact with Patient 10/19/13 1709     Chief Complaint  Patient presents with  . Fall     (Consider location/radiation/quality/duration/timing/severity/associated sxs/prior Treatment) The history is provided by the patient.  Ruth Mendoza is a 63 y.o. female who presents to the Emergency Department complaining of left knee pain and right hip pain after a mechanical fall in a local [employer].  She states there was a slippery substance on the floor which caused the fall.  She reports swelling and bruising to the knee.  Pain worse with weight bearing.  She reports right hip pain, but states the hip pain is minimal and knee pain is her main concern.  She denies LOC, neck or back pain, or head injury.    History reviewed. No pertinent past medical history. Past Surgical History  Procedure Laterality Date  . Abdominal hysterectomy    . Cholecystectomy    . Tonsillectomy    . Breast surgery     No family history on file. History  Substance Use Topics  . Smoking status: Never Smoker   . Smokeless tobacco: Not on file  . Alcohol Use: Not on file     Comment: occassional   OB History   Grav Para Term Preterm Abortions TAB SAB Ect Mult Living                 Review of Systems  Constitutional: Negative for fever and chills.  Gastrointestinal: Negative for nausea, vomiting and abdominal pain.  Genitourinary: Negative for dysuria and difficulty urinating.  Musculoskeletal: Positive for arthralgias and joint swelling. Negative for back pain, neck pain and neck stiffness.  Skin: Negative for color change and wound.  Neurological: Negative for dizziness, syncope, weakness and numbness.  All other systems reviewed and are negative.     Allergies  Codeine  Home Medications   Prior to Admission medications   Medication Sig Start Date End Date Taking? Authorizing Provider  aspirin 81 MG EC tablet Take  81 mg by mouth every morning.   Yes Historical Provider, MD  ibuprofen (ADVIL,MOTRIN) 800 MG tablet Take 1 tablet (800 mg total) by mouth 3 (three) times daily. 10/19/13   Jaxton Casale L. Carlito Bogert, PA-C   BP 139/88  Pulse 61  Temp(Src) 97.9 F (36.6 C) (Oral)  Resp 16  Ht 5\' 10"  (1.778 m)  Wt 213 lb (96.616 kg)  BMI 30.56 kg/m2  SpO2 100% Physical Exam  Nursing note and vitals reviewed. Constitutional: She is oriented to person, place, and time. She appears well-developed and well-nourished. No distress.  HENT:  Head: Normocephalic and atraumatic.  Neck: Normal range of motion. Neck supple.  Cardiovascular: Normal rate, regular rhythm, normal heart sounds and intact distal pulses.   Pulmonary/Chest: Effort normal and breath sounds normal. No respiratory distress.  Musculoskeletal: She exhibits edema and tenderness.       Left knee: She exhibits normal range of motion, no effusion, no erythema and no bony tenderness. Tenderness found. Medial joint line tenderness noted. No patellar tendon tenderness noted.       Legs: ttp of the medial left knee. Mild STS and bruising.  Pain with flexion.   No erythema, effusion, or step-off deformity.  DP pulse brisk, distal sensation intact. Calf is soft and NT.  Mild ttp of the lateral right hip.  No bruising, abrasion or bony deformity.  Pt has full ROM of the  hip  Neurological: She is alert and oriented to person, place, and time. She exhibits normal muscle tone. Coordination normal.  Skin: Skin is warm and dry. No erythema.    ED Course  Procedures (including critical care time) Labs Review Labs Reviewed - No data to display  Imaging Review Dg Hip Complete Right  10/19/2013   CLINICAL DATA:  Fall.  Right hip injury and pain.  EXAM: RIGHT HIP - COMPLETE 2+ VIEW  COMPARISON:  None.  FINDINGS: There is no evidence of hip fracture or dislocation. There is no evidence of arthropathy or other focal bone abnormality. Pelvic phleboliths incidentally noted.   IMPRESSION: Negative.   Electronically Signed   By: Earle Gell M.D.   On: 10/19/2013 18:26   Dg Knee Complete 4 Views Left  10/19/2013   CLINICAL DATA:  Fall.  Left knee injury, pain, and bruising.  EXAM: LEFT KNEE - COMPLETE 4+ VIEW  COMPARISON:  None.  FINDINGS: There is no evidence of fracture, dislocation, or joint effusion. Mild tricompartmental osteoarthritis noted. No other focal bone abnormality. Soft tissues are unremarkable.  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   Electronically Signed   By: Earle Gell M.D.   On: 10/19/2013 18:25     EKG Interpretation None      MDM   Final diagnoses:  Knee pain, acute, left  Contusion, hip, right, initial encounter    Patient is well appearing.  Ambulates with steady gait.  Has full ROM of the right hip w/o edema, bruising or abrasion.  Discussed need for close f/u with orthopedics regarding the knee pain and possible ligamentous injury.  XR negative for fx.  She requests f/u with [facility].  Knee immobilizer applied for comfort, she agrees to ice and ibuprofen. She appears stable for d/c    Joan Herschberger L. Vanessa [place], PA-C 10/21/13 [phone]

## 2013-10-22 NOTE — ED Provider Notes (Signed)
Medical screening examination/treatment/procedure(s) were performed by non-physician practitioner and as supervising physician I was immediately available for consultation/collaboration.   EKG Interpretation None       Virgel Manifold, MD 10/22/13 [phone]

## 2013-10-26 ENCOUNTER — Ambulatory Visit (HOSPITAL_COMMUNITY)
Admission: RE | Admit: 2013-10-26 | Discharge: 2013-10-26 | Disposition: A | Discharge status: Home / Self Care | Payer: BC Managed Care – PPO | Source: Ambulatory Visit | Attending: Family Medicine | Admitting: Family Medicine

## 2013-10-26 DIAGNOSIS — Z1231 Encounter for screening mammogram for malignant neoplasm of breast: Secondary | ICD-10-CM | POA: Insufficient documentation

## 2013-10-28 ENCOUNTER — Other Ambulatory Visit: Payer: Self-pay | Admitting: Family Medicine

## 2013-10-28 DIAGNOSIS — R928 Other abnormal and inconclusive findings on diagnostic imaging of breast: Secondary | ICD-10-CM

## 2013-11-01 ENCOUNTER — Other Ambulatory Visit: Payer: Self-pay | Admitting: Family Medicine

## 2013-11-01 DIAGNOSIS — R928 Other abnormal and inconclusive findings on diagnostic imaging of breast: Secondary | ICD-10-CM

## 2013-11-04 ENCOUNTER — Other Ambulatory Visit (HOSPITAL_COMMUNITY): Payer: Self-pay | Admitting: Orthopedic Surgery

## 2013-11-04 ENCOUNTER — Ambulatory Visit
Admission: RE | Admit: 2013-11-04 | Discharge: 2013-11-04 | Disposition: A | Discharge status: Home / Self Care | Payer: BC Managed Care – PPO | Source: Ambulatory Visit | Attending: Family Medicine | Admitting: Family Medicine

## 2013-11-04 ENCOUNTER — Other Ambulatory Visit: Payer: Self-pay | Admitting: Family Medicine

## 2013-11-04 ENCOUNTER — Encounter (INDEPENDENT_AMBULATORY_CARE_PROVIDER_SITE_OTHER): Payer: Self-pay

## 2013-11-04 DIAGNOSIS — M25562 Pain in left knee: Secondary | ICD-10-CM

## 2013-11-04 DIAGNOSIS — R928 Other abnormal and inconclusive findings on diagnostic imaging of breast: Secondary | ICD-10-CM

## 2013-11-09 ENCOUNTER — Ambulatory Visit (HOSPITAL_COMMUNITY)
Admission: RE | Admit: 2013-11-09 | Discharge: 2013-11-09 | Disposition: A | Discharge status: Home / Self Care | Payer: BC Managed Care – PPO | Source: Ambulatory Visit | Attending: Orthopedic Surgery | Admitting: Orthopedic Surgery

## 2013-11-09 ENCOUNTER — Encounter (HOSPITAL_COMMUNITY): Payer: Self-pay

## 2013-11-09 DIAGNOSIS — M25562 Pain in left knee: Secondary | ICD-10-CM

## 2013-11-09 DIAGNOSIS — IMO0002 Reserved for concepts with insufficient information to code with codable children: Secondary | ICD-10-CM | POA: Diagnosis not present

## 2013-11-09 DIAGNOSIS — M25569 Pain in unspecified knee: Secondary | ICD-10-CM | POA: Insufficient documentation

## 2013-11-09 DIAGNOSIS — M7120 Synovial cyst of popliteal space [Baker], unspecified knee: Secondary | ICD-10-CM | POA: Insufficient documentation

## 2013-11-09 DIAGNOSIS — M1710 Unilateral primary osteoarthritis, unspecified knee: Secondary | ICD-10-CM | POA: Insufficient documentation

## 2013-11-22 ENCOUNTER — Encounter (HOSPITAL_COMMUNITY): Payer: BC Managed Care – PPO

## 2014-10-13 ENCOUNTER — Other Ambulatory Visit: Payer: Self-pay

## 2014-10-13 DIAGNOSIS — Z1231 Encounter for screening mammogram for malignant neoplasm of breast: Secondary | ICD-10-CM

## 2014-11-20 ENCOUNTER — Ambulatory Visit
Admission: RE | Admit: 2014-11-20 | Discharge: 2014-11-20 | Disposition: A | Discharge status: Home / Self Care | Payer: BC Managed Care – PPO | Source: Ambulatory Visit

## 2014-11-20 DIAGNOSIS — Z1231 Encounter for screening mammogram for malignant neoplasm of breast: Secondary | ICD-10-CM

## 2016-01-07 ENCOUNTER — Other Ambulatory Visit: Payer: Self-pay | Admitting: Family Medicine

## 2016-01-07 DIAGNOSIS — Z1231 Encounter for screening mammogram for malignant neoplasm of breast: Secondary | ICD-10-CM

## 2016-01-15 ENCOUNTER — Ambulatory Visit: Payer: BC Managed Care – PPO

## 2016-01-17 ENCOUNTER — Ambulatory Visit
Admission: RE | Admit: 2016-01-17 | Discharge: 2016-01-17 | Disposition: A | Discharge status: Home / Self Care | Payer: BC Managed Care – PPO | Source: Ambulatory Visit | Attending: Family Medicine | Admitting: Family Medicine

## 2016-01-17 DIAGNOSIS — Z1231 Encounter for screening mammogram for malignant neoplasm of breast: Secondary | ICD-10-CM

## 2016-02-27 IMAGING — CR DG CHEST 2V
2 series · 2 of 2 positions shown · non-contrast
Comparison: 10/14/2007 and 06/23/2005

CLINICAL DATA: Chest pain and shortness of breath on exertion.

EXAM:
CHEST  2 VIEW

[view not recorded (1 of 2)]
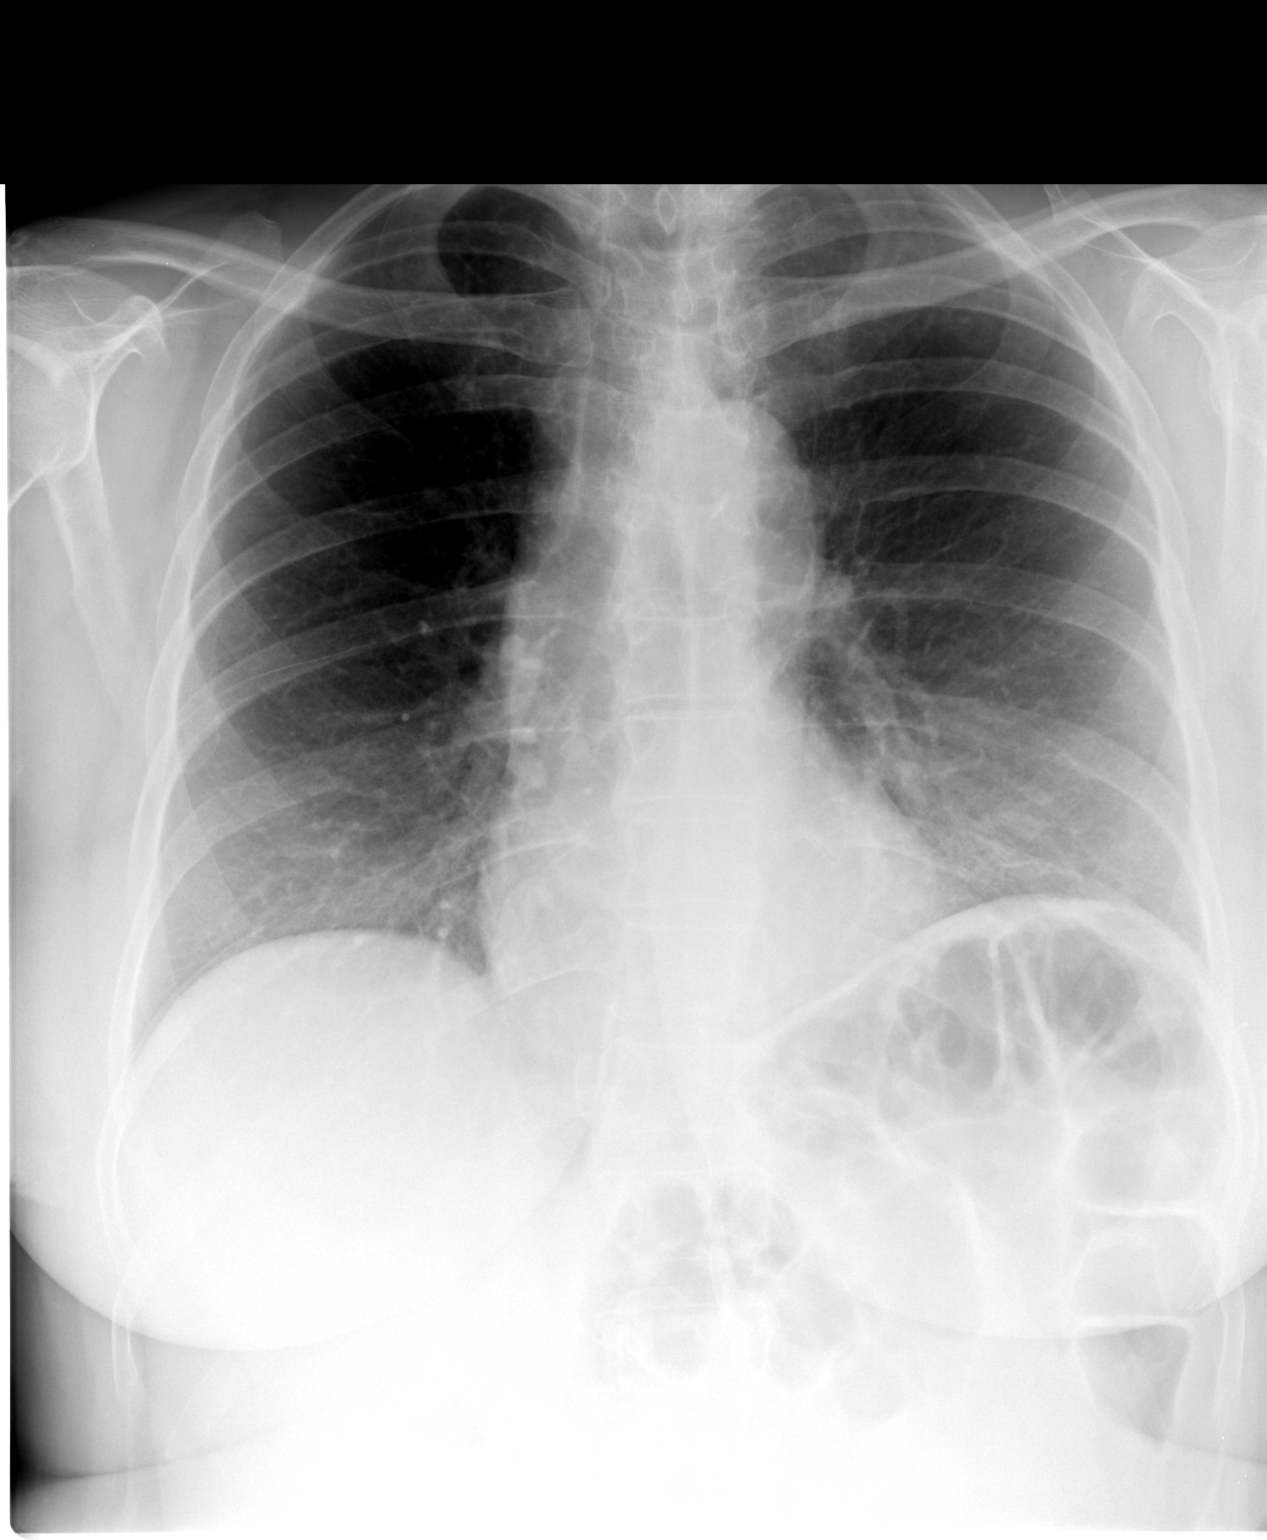

[view not recorded (2 of 2)]
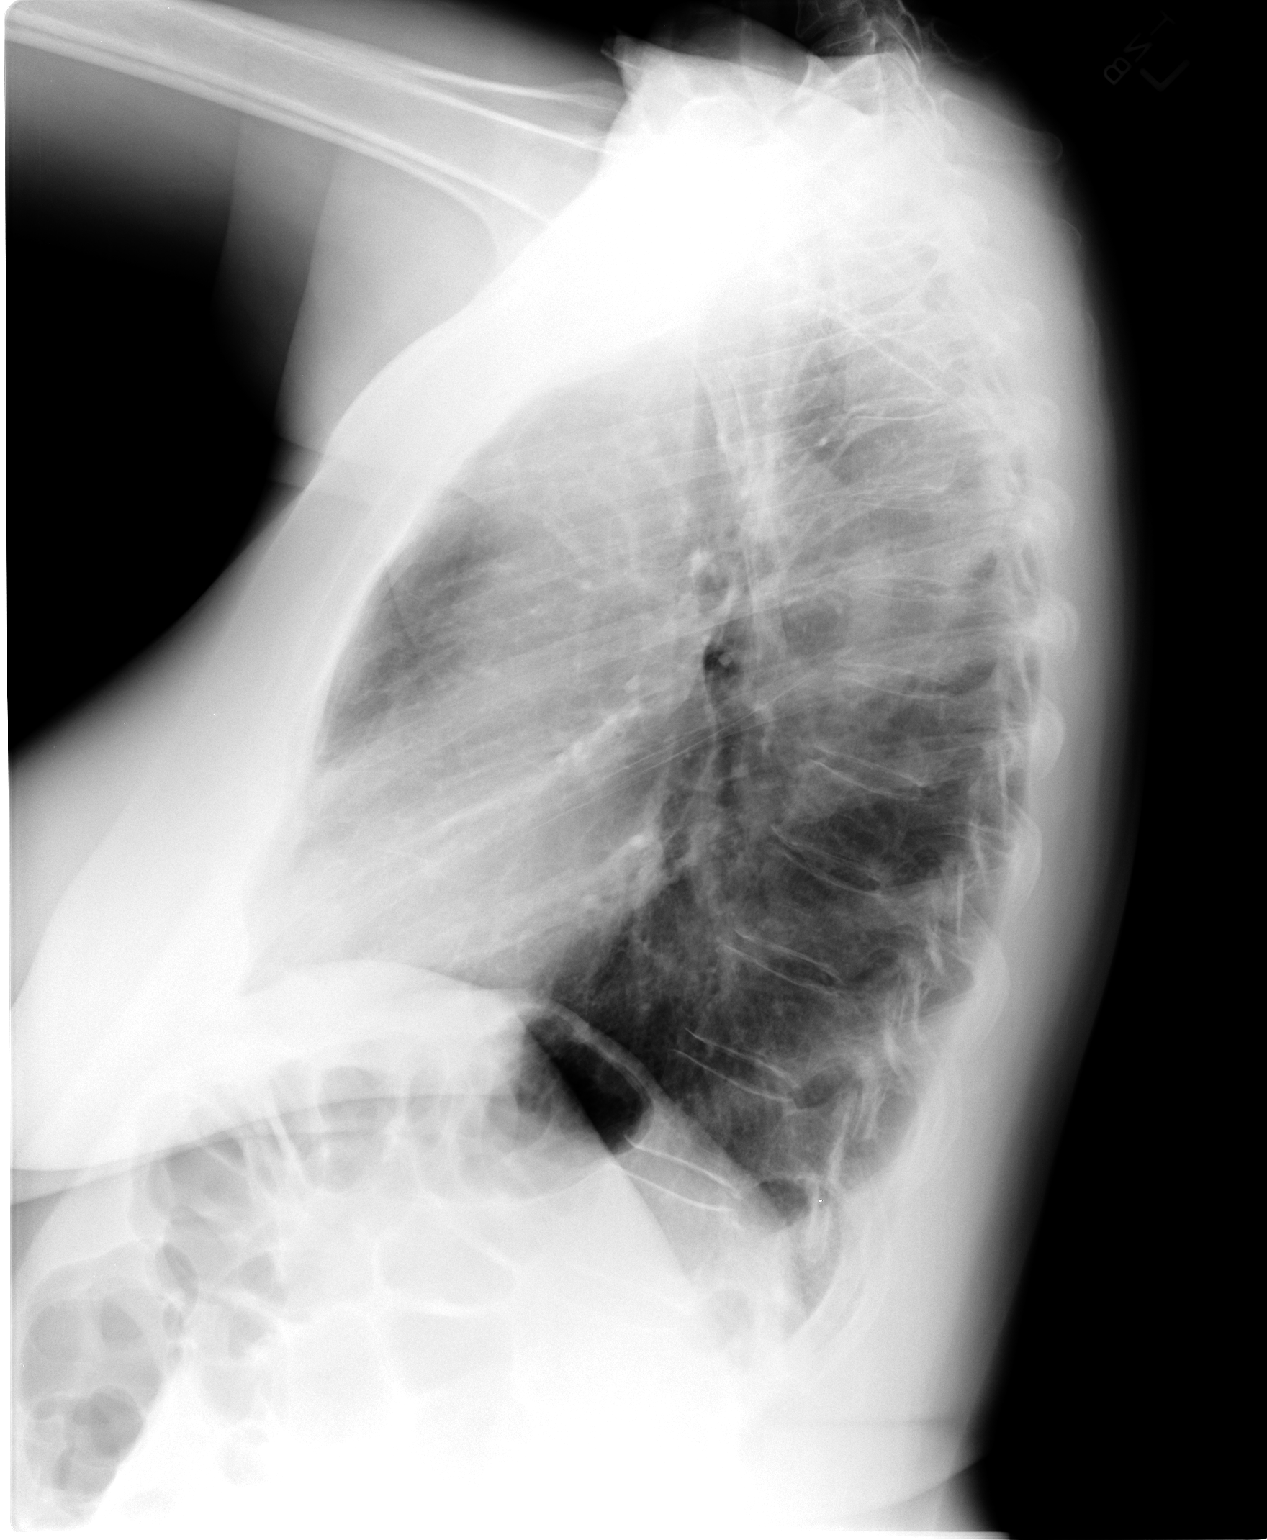

[2 of 2 positions shown; findings below may reference images not displayed]

FINDINGS: Heart size and pulmonary vascularity are normal and the lungs are
clear. No osseous abnormality.
IMPRESSION: Normal exam.

## 2016-05-02 ENCOUNTER — Encounter: Payer: Self-pay | Admitting: Internal Medicine

## 2016-12-16 ENCOUNTER — Telehealth: Payer: Self-pay | Admitting: General Practice

## 2016-12-16 NOTE — Telephone Encounter (Signed)
Patient called in, however she meant to call Dr. Olevia Perches office, because he is her primary GI doctor.

## 2017-01-30 ENCOUNTER — Other Ambulatory Visit: Payer: Self-pay | Admitting: Family Medicine

## 2017-01-30 DIAGNOSIS — Z1231 Encounter for screening mammogram for malignant neoplasm of breast: Secondary | ICD-10-CM

## 2017-03-02 ENCOUNTER — Ambulatory Visit
Admission: RE | Admit: 2017-03-02 | Discharge: 2017-03-02 | Disposition: A | Discharge status: Home / Self Care | Payer: Self-pay | Source: Ambulatory Visit | Attending: Family Medicine | Admitting: Family Medicine

## 2017-03-02 ENCOUNTER — Ambulatory Visit: Payer: BC Managed Care – PPO

## 2017-03-02 DIAGNOSIS — Z1231 Encounter for screening mammogram for malignant neoplasm of breast: Secondary | ICD-10-CM

## 2017-03-16 ENCOUNTER — Encounter (INDEPENDENT_AMBULATORY_CARE_PROVIDER_SITE_OTHER): Payer: Self-pay | Admitting: *Deleted

## 2017-07-07 ENCOUNTER — Telehealth (INDEPENDENT_AMBULATORY_CARE_PROVIDER_SITE_OTHER): Payer: Self-pay | Admitting: Internal Medicine

## 2017-07-07 NOTE — Telephone Encounter (Signed)
Patient called in ref to a referral for colonoscopy - it looks like you sent her a letter on 03-16-18. I told patient that I would let you know she didn't receive it and you would be sending her another questionaire. Patient ph# [phone]

## 2017-07-08 ENCOUNTER — Encounter (INDEPENDENT_AMBULATORY_CARE_PROVIDER_SITE_OTHER): Payer: Self-pay | Admitting: *Deleted

## 2017-07-08 NOTE — Telephone Encounter (Signed)
[facility] letter mailed to patient

## 2017-07-30 ENCOUNTER — Other Ambulatory Visit (INDEPENDENT_AMBULATORY_CARE_PROVIDER_SITE_OTHER): Payer: Self-pay | Admitting: *Deleted

## 2017-07-30 DIAGNOSIS — Z1211 Encounter for screening for malignant neoplasm of colon: Secondary | ICD-10-CM

## 2017-09-29 ENCOUNTER — Telehealth (INDEPENDENT_AMBULATORY_CARE_PROVIDER_SITE_OTHER): Payer: Self-pay | Admitting: *Deleted

## 2017-09-29 ENCOUNTER — Encounter (INDEPENDENT_AMBULATORY_CARE_PROVIDER_SITE_OTHER): Payer: Self-pay | Admitting: *Deleted

## 2017-09-29 MED ORDER — PEG 3350-KCL-NA BICARB-NACL 420 G PO SOLR: 4000.0000 mL | Freq: Once | ORAL | 0 refills | Status: AC

## 2017-09-29 NOTE — Telephone Encounter (Signed)
Patient needs trilyte 

## 2017-10-20 ENCOUNTER — Telehealth (INDEPENDENT_AMBULATORY_CARE_PROVIDER_SITE_OTHER): Payer: Self-pay | Admitting: *Deleted

## 2017-10-20 NOTE — Telephone Encounter (Signed)
Referring MD/PCP: golding   Procedure: tcs  Reason/Indication:  screening  Has patient had this procedure before?  no  If so, when, by whom and where?    Is there a family history of colon cancer?  no  Who?  What age when diagnosed?    Is patient diabetic?   no      Does patient have prosthetic heart valve or mechanical valve?  no  Do you have a pacemaker?  nono  Has patient ever had endocarditis? no  Has patient had joint replacement within last 12 months?  no  Is patient constipated or do they take laxatives? no  Does patient have a history of alcohol/drug use?  no  Is patient on blood thinner such as Coumadin, Plavix and/or Aspirin? no  Medications: none  Allergies: keflex  Medication Adjustment per Dr Lindi Adie, NP:   Procedure date & time: 11/18/17 at 930

## 2017-10-21 NOTE — Telephone Encounter (Signed)
agree

## 2017-11-18 ENCOUNTER — Encounter (HOSPITAL_COMMUNITY): Payer: Self-pay | Admitting: *Deleted

## 2017-11-18 ENCOUNTER — Encounter (HOSPITAL_COMMUNITY)
Admission: RE | Disposition: A | Discharge status: Home / Self Care | Payer: Self-pay | Source: Ambulatory Visit | Attending: Internal Medicine

## 2017-11-18 ENCOUNTER — Other Ambulatory Visit: Payer: Self-pay

## 2017-11-18 ENCOUNTER — Ambulatory Visit (HOSPITAL_COMMUNITY)
Admission: RE | Admit: 2017-11-18 | Discharge: 2017-11-18 | Disposition: A | Discharge status: Home / Self Care | Payer: Medicare Other | Source: Ambulatory Visit | Attending: Internal Medicine | Admitting: Internal Medicine

## 2017-11-18 DIAGNOSIS — Z1211 Encounter for screening for malignant neoplasm of colon: Secondary | ICD-10-CM | POA: Diagnosis present

## 2017-11-18 DIAGNOSIS — D122 Benign neoplasm of ascending colon: Secondary | ICD-10-CM | POA: Insufficient documentation

## 2017-11-18 DIAGNOSIS — Z885 Allergy status to narcotic agent status: Secondary | ICD-10-CM | POA: Insufficient documentation

## 2017-11-18 DIAGNOSIS — K644 Residual hemorrhoidal skin tags: Secondary | ICD-10-CM | POA: Insufficient documentation

## 2017-11-18 DIAGNOSIS — K621 Rectal polyp: Secondary | ICD-10-CM

## 2017-11-18 DIAGNOSIS — D123 Benign neoplasm of transverse colon: Secondary | ICD-10-CM | POA: Insufficient documentation

## 2017-11-18 DIAGNOSIS — D128 Benign neoplasm of rectum: Secondary | ICD-10-CM | POA: Insufficient documentation

## 2017-11-18 DIAGNOSIS — D124 Benign neoplasm of descending colon: Secondary | ICD-10-CM | POA: Insufficient documentation

## 2017-11-18 DIAGNOSIS — D125 Benign neoplasm of sigmoid colon: Secondary | ICD-10-CM

## 2017-11-18 HISTORY — DX: Other specified postprocedural states: Z98.890

## 2017-11-18 HISTORY — PX: COLONOSCOPY: SHX5424

## 2017-11-18 HISTORY — PX: POLYPECTOMY: SHX5525

## 2017-11-18 HISTORY — DX: Nausea with vomiting, unspecified: R11.2

## 2017-11-18 SURGERY — COLONOSCOPY
Anesthesia: Moderate Sedation

## 2017-11-18 MED ORDER — SODIUM CHLORIDE 0.9 % IV SOLN
INTRAVENOUS | Status: DC
  Administered 2017-11-18: 09:00:00 via INTRAVENOUS

## 2017-11-18 NOTE — Op Note (Signed)
[facility] Patient Name: Ruth Mendoza Procedure Date: 11/18/2017 9:26 AM MRN: [id number] Date of Birth: 1950/10/13 Attending MD: Hildred Laser , MD CSN: [id number] Age: 67 Admit Type: Outpatient Procedure:                Colonoscopy Indications:              Screening for colorectal malignant neoplasm Providers:                Hildred Laser, MD, Janeece Riggers, RN, Nelma Rothman,                            Technician Referring MD:             Halford Chessman, MD Medicines:                None Complications:            No immediate complications. Estimated Blood Loss:     Estimated blood loss was minimal. Procedure:                Pre-Anesthesia Assessment:                           - Prior to the procedure, a History and Physical                            was performed, and patient medications and                            allergies were reviewed. The patient's tolerance of                            previous anesthesia was also reviewed. The risks                            and benefits of the procedure and the sedation                            options and risks were discussed with the patient.                            All questions were answered, and informed consent                            was obtained. Prior Anticoagulants: The patient has                            taken no previous anticoagulant or antiplatelet                            agents. ASA Grade Assessment: I - A normal, healthy                            patient. After reviewing the risks and benefits,  the patient was deemed in satisfactory condition to                            undergo the procedure.                           After obtaining informed consent, the colonoscope                            was passed under direct vision. Throughout the                            procedure, the patient's blood pressure, pulse, and                            oxygen saturations were  monitored continuously. The                            [id number]-[id number] ([id number]) scope was introduced through                            the anus and advanced to the the cecum, identified                            by appendiceal orifice and ileocecal valve. The                            colonoscopy was technically difficult and complex                            due to a redundant colon. The patient tolerated the                            procedure well. The quality of the bowel                            preparation was good. The ileocecal valve,                            appendiceal orifice, and rectum were photographed. Scope In: 9:38:27 AM Scope Out: 10:29:07 AM Scope Withdrawal Time: 0 hours 26 minutes 40 seconds  Total Procedure Duration: 0 hours 50 minutes 40 seconds  Findings:      The perianal and digital rectal examinations were normal.      Four sessile polyps were found in the rectum, sigmoid colon, splenic       flexure and hepatic flexure. The polyps were small in size. These polyps       were removed with a cold snare. Resection and retrieval were complete.       The pathology specimen was placed into Bottle Number 1.      A 6 to 10 mm polyp was found in the ascending colon. The polyp was flat.       The polyp was removed with a piecemeal technique using a hot snare.       Resection and retrieval  were complete. [Clip Device]. The pathology       specimen was placed into Bottle Number 2.      External hemorrhoids were found during retroflexion. The hemorrhoids       were small. Impression:               - Four small polyps in the rectum, in the sigmoid                            colon, at the splenic flexure and at the hepatic                            flexure, removed with a cold snare. Resected and                            retrieved.                           - One 6 to 10 mm polyp in the ascending colon,                            removed piecemeal using a hot snare.  Resected and                            retrieved.                           - External hemorrhoids. Moderate Sedation:      None Recommendation:           - Patient has a contact number available for                            emergencies. The signs and symptoms of potential                            delayed complications were discussed with the                            patient. Return to normal activities tomorrow.                            Written discharge instructions were provided to the                            patient.                           - Resume previous diet today.                           - Continue present medications.                           - No aspirin, ibuprofen, naproxen, or other  non-steroidal anti-inflammatory drugs for 7 days.                           - Await pathology results.                           - Repeat colonoscopy is recommended. The                            colonoscopy date will be determined after pathology                            results from today's exam become available for                            review. Procedure Code(s):        --- Professional ---                           [phone], Colonoscopy, flexible; with removal of                            tumor(s), polyp(s), or other lesion(s) by snare                            technique Diagnosis Code(s):        --- Professional ---                           Z12.11, Encounter for screening for malignant                            neoplasm of colon                           K62.1, Rectal polyp                           D12.5, Benign neoplasm of sigmoid colon                           D12.3, Benign neoplasm of transverse colon (hepatic                            flexure or splenic flexure)                           D12.2, Benign neoplasm of ascending colon                           K64.4, Residual hemorrhoidal skin tags CPT copyright 2017 American Medical  Association. All rights reserved. The codes documented in this report are preliminary and upon coder review may  be revised to meet current compliance requirements. Hildred Laser, MD Hildred Laser, MD 11/18/2017 10:39:49 AM This report has been signed electronically. Number of Addenda: 0

## 2017-11-18 NOTE — Discharge Instructions (Signed)
No aspirin or NSAIDs for 1 week. Resume usual diet. Physician will call with biopsy results.  Colonoscopy, Adult, Care After This sheet gives you information about how to care for yourself after your procedure. Your health care provider may also give you more specific instructions. If you have problems or questions, contact your health care provider. What can I expect after the procedure? After the procedure, it is common to have:  A small amount of blood in your stool for 24 hours after the procedure.  Some gas.  Mild abdominal cramping or bloating.  Follow these instructions at home: General instructions   It is up to you to get the results of your procedure. Ask your health care provider, or the department performing the procedure, when your results will be ready. Relieving cramping and bloating. Contact a health care provider if:  You have blood in your stool 2-3 days after the procedure. Get help right away if:  You have more than a small spotting of blood in your stool.  You pass large blood clots in your stool.  Your abdomen is swollen.  You have nausea or vomiting.  You have a fever.  You have increasing abdominal pain that is not relieved with medicine. This information is not intended to replace advice given to you by your health care provider. Make sure you discuss any questions you have with your health care provider. Document Released: 10/30/2003 Document Revised: 12/10/2015 Document Reviewed: 05/29/2015 Elsevier Interactive Patient Education  [employer].

## 2017-11-18 NOTE — H&P (Deleted)
Ruth Mendoza is an 67 y.o. female.   Chief Complaint: Patient is here for colonoscopy. HPI: Patient 67 year old Caucasian female who is here for screening colonoscopy.  She denies abdominal pain change in bowel habits or rectal bleeding.  Last colonoscopy was normal 5 years ago. Ruth Mendoza history significant for CRC mother was around 7 at the time of diagnosis and died at 14 of unrelated causes.  Maternal uncle had melanoma at around age 22.  Sister died of glioblastoma at age 9 within couple of days of diagnosis.   Past Medical History:  Diagnosis Date  . PONV (postoperative nausea and vomiting)     Past Surgical History:  Procedure Laterality Date  . ABDOMINAL HYSTERECTOMY    . BREAST CYST EXCISION Left 30 yrs ago  . BREAST SURGERY    . CHOLECYSTECTOMY    . TONSILLECTOMY      Family History  Problem Relation Age of Onset  . Colon cancer Neg Hx    Social History:  reports that she has never smoked. She has never used smokeless tobacco. She reports that she does not use drugs. Her alcohol history is not on file.  Allergies:  Allergies  Allergen Reactions  . Codeine Nausea And Vomiting    No medications prior to admission.    No results found for this or any previous visit (from the past 48 hour(s)). No results found.  ROS  Blood pressure 115/83, pulse 72, temperature (!) 97.5 F (36.4 C), temperature source Oral, resp. rate 18, height 5\' 11"  (1.803 m), weight 81.6 kg, SpO2 99 %. Physical Exam  Constitutional: She appears well-developed and well-nourished.  HENT:  Mouth/Throat: Oropharynx is clear and moist.  Eyes: Conjunctivae are normal. No scleral icterus.  Neck: No thyromegaly present.  Cardiovascular: Normal rate, regular rhythm and normal heart sounds.  No murmur heard. Respiratory: Effort normal and breath sounds normal.  GI: Soft. She exhibits no distension and no mass. There is no tenderness.  Musculoskeletal: She exhibits no edema.  Lymphadenopathy:   She has no cervical adenopathy.  Neurological: She is alert.  Skin: Skin is warm and dry.     Assessment/Plan Screening colonoscopy. Family history of CRC in first-degree relative.  Ruth Laser, MD 11/18/2017, 10:54 AM

## 2017-11-18 NOTE — H&P (Signed)
Ruth Mendoza is an 67 y.o. female.   Chief Complaint: Ruth Mendoza is here for colonoscopy. HPI: Ruth Mendoza 67 year old Caucasian female who is here for screening colonoscopy.  Last exam was in April 2008 was normal other than hemorrhoids.  She denies abdominal pain change in bowel habits or rectal bleeding. Family history is negative for CRC.  Past Medical History:  Diagnosis Date  . PONV (postoperative nausea and vomiting)     Past Surgical History:  Procedure Laterality Date  . ABDOMINAL HYSTERECTOMY    . BREAST CYST EXCISION Left 30 yrs ago  . BREAST SURGERY    . CHOLECYSTECTOMY    . TONSILLECTOMY      Family History  Problem Relation Age of Onset  . Colon cancer Neg Hx    Social History:  reports that she has never smoked. She has never used smokeless tobacco. She reports that she does not use drugs. Her alcohol history is not on file.  Allergies:  Allergies  Allergen Reactions  . Codeine Nausea And Vomiting    Medications Prior to Admission  Medication Sig Dispense Refill  . ibuprofen (ADVIL,MOTRIN) 800 MG tablet Take 1 tablet (800 mg total) by mouth 3 (three) times daily. (Ruth Mendoza not taking: Reported on 11/13/2017) 21 tablet 0    No results found for this or any previous visit (from the past 48 hour(s)). No results found.  ROS  Blood pressure 112/76, pulse 66, temperature 97.9 F (36.6 C), temperature source Oral, resp. rate 17, height 5\' 11"  (1.803 m), weight 81.6 kg, SpO2 96 %. Physical Exam  Constitutional: She appears well-developed and well-nourished.  HENT:  Mouth/Throat: Oropharynx is clear and moist.  Eyes: Conjunctivae are normal. No scleral icterus.  Neck: No thyromegaly present.  Cardiovascular: Normal rate, regular rhythm and normal heart sounds.  No murmur heard. Respiratory: Effort normal and breath sounds normal.  GI:  Abdomen is symmetrical with right subcostal scars and Pfannenstiel scar.  It is soft and nontender with organomegaly or masses.   Musculoskeletal: She exhibits no edema.  Lymphadenopathy:    She has no cervical adenopathy.  Neurological: She is alert.  Skin: Skin is warm and dry.     Assessment/Plan Average risk screening colonoscopy.  Hildred Laser, MD 11/18/2017, 9:34 AM

## 2017-11-23 ENCOUNTER — Encounter (HOSPITAL_COMMUNITY): Payer: Self-pay | Admitting: Internal Medicine

## 2018-03-03 ENCOUNTER — Other Ambulatory Visit: Payer: Self-pay | Admitting: Family Medicine

## 2018-03-03 DIAGNOSIS — Z1231 Encounter for screening mammogram for malignant neoplasm of breast: Secondary | ICD-10-CM

## 2018-04-09 ENCOUNTER — Ambulatory Visit
Admission: RE | Admit: 2018-04-09 | Discharge: 2018-04-09 | Disposition: A | Discharge status: Home / Self Care | Payer: Medicare Other | Source: Ambulatory Visit | Attending: Family Medicine | Admitting: Family Medicine

## 2018-04-09 DIAGNOSIS — Z1231 Encounter for screening mammogram for malignant neoplasm of breast: Secondary | ICD-10-CM

## 2019-03-29 ENCOUNTER — Other Ambulatory Visit: Payer: Self-pay | Admitting: Family Medicine

## 2019-03-29 DIAGNOSIS — Z1231 Encounter for screening mammogram for malignant neoplasm of breast: Secondary | ICD-10-CM

## 2019-04-26 ENCOUNTER — Ambulatory Visit
Admission: RE | Admit: 2019-04-26 | Discharge: 2019-04-26 | Disposition: A | Discharge status: Home / Self Care | Payer: Medicare Other | Source: Ambulatory Visit | Attending: Family Medicine | Admitting: Family Medicine

## 2019-04-26 ENCOUNTER — Other Ambulatory Visit: Payer: Self-pay

## 2019-04-26 DIAGNOSIS — Z1231 Encounter for screening mammogram for malignant neoplasm of breast: Secondary | ICD-10-CM

## 2019-05-27 DIAGNOSIS — E6609 Other obesity due to excess calories: Secondary | ICD-10-CM | POA: Diagnosis not present

## 2019-05-27 DIAGNOSIS — N644 Mastodynia: Secondary | ICD-10-CM | POA: Diagnosis not present

## 2019-05-27 DIAGNOSIS — N6332 Unspecified lump in axillary tail of the left breast: Secondary | ICD-10-CM | POA: Diagnosis not present

## 2019-05-27 DIAGNOSIS — Z6830 Body mass index (BMI) 30.0-30.9, adult: Secondary | ICD-10-CM | POA: Diagnosis not present

## 2020-05-29 ENCOUNTER — Other Ambulatory Visit: Payer: Self-pay | Admitting: Family Medicine

## 2020-05-29 DIAGNOSIS — Z1231 Encounter for screening mammogram for malignant neoplasm of breast: Secondary | ICD-10-CM

## 2020-07-09 ENCOUNTER — Ambulatory Visit: Payer: Medicare PPO

## 2020-07-18 ENCOUNTER — Ambulatory Visit: Payer: Medicare PPO

## 2020-08-31 ENCOUNTER — Other Ambulatory Visit: Payer: Self-pay

## 2020-08-31 ENCOUNTER — Ambulatory Visit
Admission: RE | Admit: 2020-08-31 | Discharge: 2020-08-31 | Disposition: A | Discharge status: Home / Self Care | Payer: Medicare PPO | Source: Ambulatory Visit | Attending: Family Medicine | Admitting: Family Medicine

## 2020-08-31 DIAGNOSIS — Z1231 Encounter for screening mammogram for malignant neoplasm of breast: Secondary | ICD-10-CM | POA: Diagnosis not present

## 2020-10-04 DIAGNOSIS — Z1283 Encounter for screening for malignant neoplasm of skin: Secondary | ICD-10-CM | POA: Diagnosis not present

## 2020-10-04 DIAGNOSIS — L821 Other seborrheic keratosis: Secondary | ICD-10-CM | POA: Diagnosis not present

## 2020-10-04 DIAGNOSIS — D225 Melanocytic nevi of trunk: Secondary | ICD-10-CM | POA: Diagnosis not present

## 2020-10-04 DIAGNOSIS — L858 Other specified epidermal thickening: Secondary | ICD-10-CM | POA: Diagnosis not present

## 2021-01-24 DIAGNOSIS — Z818 Family history of other mental and behavioral disorders: Secondary | ICD-10-CM | POA: Diagnosis not present

## 2021-01-24 DIAGNOSIS — R030 Elevated blood-pressure reading, without diagnosis of hypertension: Secondary | ICD-10-CM | POA: Diagnosis not present

## 2021-01-24 DIAGNOSIS — R32 Unspecified urinary incontinence: Secondary | ICD-10-CM | POA: Diagnosis not present

## 2021-01-24 DIAGNOSIS — Z8542 Personal history of malignant neoplasm of other parts of uterus: Secondary | ICD-10-CM | POA: Diagnosis not present

## 2021-01-24 DIAGNOSIS — Z7722 Contact with and (suspected) exposure to environmental tobacco smoke (acute) (chronic): Secondary | ICD-10-CM | POA: Diagnosis not present

## 2021-01-24 DIAGNOSIS — Z8249 Family history of ischemic heart disease and other diseases of the circulatory system: Secondary | ICD-10-CM | POA: Diagnosis not present

## 2021-01-24 DIAGNOSIS — Z825 Family history of asthma and other chronic lower respiratory diseases: Secondary | ICD-10-CM | POA: Diagnosis not present

## 2021-02-18 DIAGNOSIS — U071 COVID-19: Secondary | ICD-10-CM | POA: Diagnosis not present

## 2021-03-12 DIAGNOSIS — Z23 Encounter for immunization: Secondary | ICD-10-CM | POA: Diagnosis not present

## 2021-03-12 DIAGNOSIS — Z1331 Encounter for screening for depression: Secondary | ICD-10-CM | POA: Diagnosis not present

## 2021-03-12 DIAGNOSIS — Z0000 Encounter for general adult medical examination without abnormal findings: Secondary | ICD-10-CM | POA: Diagnosis not present

## 2021-03-12 DIAGNOSIS — E7849 Other hyperlipidemia: Secondary | ICD-10-CM | POA: Diagnosis not present

## 2021-03-12 DIAGNOSIS — E782 Mixed hyperlipidemia: Secondary | ICD-10-CM | POA: Diagnosis not present

## 2021-03-12 DIAGNOSIS — Z6830 Body mass index (BMI) 30.0-30.9, adult: Secondary | ICD-10-CM | POA: Diagnosis not present

## 2021-03-12 DIAGNOSIS — T466X5D Adverse effect of antihyperlipidemic and antiarteriosclerotic drugs, subsequent encounter: Secondary | ICD-10-CM | POA: Diagnosis not present

## 2021-03-12 DIAGNOSIS — E663 Overweight: Secondary | ICD-10-CM | POA: Diagnosis not present

## 2021-05-31 ENCOUNTER — Other Ambulatory Visit: Payer: Self-pay

## 2021-05-31 ENCOUNTER — Encounter: Payer: Self-pay | Admitting: Emergency Medicine

## 2021-05-31 ENCOUNTER — Ambulatory Visit
Admission: EM | Admit: 2021-05-31 | Discharge: 2021-05-31 | Disposition: A | Discharge status: Home / Self Care | Payer: Medicare PPO | Attending: Urgent Care | Admitting: Urgent Care

## 2021-05-31 DIAGNOSIS — R070 Pain in throat: Secondary | ICD-10-CM | POA: Diagnosis not present

## 2021-05-31 DIAGNOSIS — J069 Acute upper respiratory infection, unspecified: Secondary | ICD-10-CM | POA: Diagnosis not present

## 2021-05-31 DIAGNOSIS — R052 Subacute cough: Secondary | ICD-10-CM | POA: Diagnosis not present

## 2021-05-31 MED ORDER — PSEUDOEPHEDRINE HCL 60 MG PO TABS: 60.0000 mg | ORAL_TABLET | Freq: Three times a day (TID) | ORAL | 0 refills | Status: DC | PRN

## 2021-05-31 MED ORDER — PROMETHAZINE-DM 6.25-15 MG/5ML PO SYRP: 5.0000 mL | ORAL_SOLUTION | Freq: Every evening | ORAL | 0 refills | Status: DC | PRN

## 2021-05-31 MED ORDER — LEVOCETIRIZINE DIHYDROCHLORIDE 5 MG PO TABS: 5.0000 mg | ORAL_TABLET | Freq: Every evening | ORAL | 0 refills | Status: DC

## 2021-05-31 MED ORDER — BENZONATATE 100 MG PO CAPS: 100.0000 mg | ORAL_CAPSULE | Freq: Three times a day (TID) | ORAL | 0 refills | Status: DC | PRN

## 2021-05-31 NOTE — ED Triage Notes (Signed)
Pt reports sore throat, cough, sinus congestion, fever since Wednesday. Pt reports took home covid last night with negative result.  ?

## 2021-05-31 NOTE — ED Provider Notes (Signed)
?[place] ? ? ?MRN: [id number] DOB: 1950/04/25 ? ?Subjective:  ? ?Ruth Mendoza is a 71 y.o. female presenting for 2 to 3-day history of acute onset persistent worsening cough, sinus congestion, throat pain.  Concern is that the infection is in her lungs.  No chest pain, shortness of breath or wheezing.  Took a COVID test that was negative.  Has 1 sick contact with her husband who is being seen as well. ? ?Denies taking chronic medications. ? ?Allergies  ?Allergen Reactions  ? Codeine Nausea And Vomiting  ? ? ?Past Medical History:  ?Diagnosis Date  ? PONV (postoperative nausea and vomiting)   ?  ? ?Past Surgical History:  ?Procedure Laterality Date  ? ABDOMINAL HYSTERECTOMY    ? BREAST BIOPSY    ? BREAST CYST EXCISION Left 30 yrs ago  ? BREAST SURGERY    ? CHOLECYSTECTOMY    ? COLONOSCOPY N/A 11/18/2017  ? Procedure: COLONOSCOPY;  Surgeon: Rogene Houston, MD;  [facility];  Service: Endoscopy;  Laterality: N/A;  930  ? POLYPECTOMY  11/18/2017  ? Procedure: POLYPECTOMY;  Surgeon: Rogene Houston, MD;  Location: [facility];  Service: Endoscopy;;  splenic flexure polyp cs, hepatic flexure polyp cs ascending colon polyp hs, distal sigmoid colon polyp cs, rectal polyp cs  ? TONSILLECTOMY    ? ? ?Family History  ?Problem Relation Age of Onset  ? Breast cancer Paternal Grandmother   ? Colon cancer Neg Hx   ? ? ?Social History  ? ?Tobacco Use  ? Smoking status: Never  ? Smokeless tobacco: Never  ?Vaping Use  ? Vaping Use: Never used  ?Substance Use Topics  ? Drug use: Never  ? ? ?ROS ? ? ?Objective:  ? ?Vitals: ?BP 132/85 (BP Location: Right Arm)   Pulse 87   Temp 98 ?F (36.7 ?C) (Oral)   Resp 18   Ht 5\' 10"  (1.778 m)   Wt 200 lb (90.7 kg)   SpO2 93%   BMI 28.70 kg/m?  ? ?Physical Exam ?Constitutional:   ?   General: She is not in acute distress. ?   Appearance: Normal appearance. She is well-developed and normal weight. She is not ill-appearing, toxic-appearing or diaphoretic.   ?HENT:  ?   Head: Normocephalic and atraumatic.  ?   Right Ear: Tympanic membrane, ear canal and external ear normal. No drainage or tenderness. No middle ear effusion. There is no impacted cerumen. Tympanic membrane is not erythematous.  ?   Left Ear: Tympanic membrane, ear canal and external ear normal. No drainage or tenderness.  No middle ear effusion. There is no impacted cerumen. Tympanic membrane is not erythematous.  ?   Nose: Congestion present. No rhinorrhea.  ?   Mouth/Throat:  ?   Mouth: Mucous membranes are moist. No oral lesions.  ?   Pharynx: No pharyngeal swelling, oropharyngeal exudate, posterior oropharyngeal erythema or uvula swelling.  ?   Tonsils: No tonsillar exudate or tonsillar abscesses.  ?   Comments: Postnasal drainage overlying the pharynx. ?Eyes:  ?   General: No scleral icterus.    ?   Right eye: No discharge.     ?   Left eye: No discharge.  ?   Extraocular Movements: Extraocular movements intact.  ?   Right eye: Normal extraocular motion.  ?   Left eye: Normal extraocular motion.  ?   Conjunctiva/sclera: Conjunctivae normal.  ?Cardiovascular:  ?   Rate and Rhythm: Normal rate.  ?  Heart sounds: No murmur heard. ?  No friction rub. No gallop.  ?Pulmonary:  ?   Effort: Pulmonary effort is normal. No respiratory distress.  ?   Breath sounds: No stridor. No wheezing, rhonchi or rales.  ?Chest:  ?   Chest wall: No tenderness.  ?Musculoskeletal:  ?   Cervical back: Normal range of motion and neck supple.  ?Lymphadenopathy:  ?   Cervical: No cervical adenopathy.  ?Skin: ?   General: Skin is warm and dry.  ?Neurological:  ?   General: No focal deficit present.  ?   Mental Status: She is alert and oriented to person, place, and time.  ?Psychiatric:     ?   Mood and Affect: Mood normal.     ?   Behavior: Behavior normal.     ?   Thought Content: Thought content normal.     ?   Judgment: Judgment normal.  ? ? ?Assessment and Plan :  ? ?PDMP not reviewed this encounter. ? ?1. Viral upper  respiratory illness   ?2. Subacute cough   ?3. Throat pain   ? ?We will defer repeat COVID test. Deferred imaging given clear cardiopulmonary exam, hemodynamically stable vital signs. Suspect viral URI, viral syndrome. Physical exam findings reassuring and vital signs stable for discharge. Advised supportive care, offered symptomatic relief. Counseled patient on potential for adverse effects with medications prescribed/recommended today, ER and return-to-clinic precautions discussed, patient verbalized understanding.  ? ?  ?Jaynee Eagles, PA-C ?05/31/21 0830 ? ?

## 2021-06-02 ENCOUNTER — Ambulatory Visit
Admission: EM | Admit: 2021-06-02 | Discharge: 2021-06-02 | Disposition: A | Discharge status: Home / Self Care | Payer: Medicare PPO | Attending: Urgent Care | Admitting: Urgent Care

## 2021-06-02 ENCOUNTER — Other Ambulatory Visit: Payer: Self-pay

## 2021-06-02 ENCOUNTER — Ambulatory Visit (INDEPENDENT_AMBULATORY_CARE_PROVIDER_SITE_OTHER): Payer: Medicare PPO

## 2021-06-02 ENCOUNTER — Encounter: Payer: Self-pay | Admitting: Emergency Medicine

## 2021-06-02 DIAGNOSIS — R509 Fever, unspecified: Secondary | ICD-10-CM | POA: Diagnosis not present

## 2021-06-02 DIAGNOSIS — R0789 Other chest pain: Secondary | ICD-10-CM | POA: Diagnosis not present

## 2021-06-02 DIAGNOSIS — R0602 Shortness of breath: Secondary | ICD-10-CM | POA: Diagnosis not present

## 2021-06-02 DIAGNOSIS — R051 Acute cough: Secondary | ICD-10-CM

## 2021-06-02 DIAGNOSIS — R059 Cough, unspecified: Secondary | ICD-10-CM | POA: Diagnosis not present

## 2021-06-02 DIAGNOSIS — J180 Bronchopneumonia, unspecified organism: Secondary | ICD-10-CM | POA: Diagnosis not present

## 2021-06-02 MED ORDER — AMOXICILLIN 500 MG PO CAPS: 1000.0000 mg | ORAL_CAPSULE | Freq: Three times a day (TID) | ORAL | 0 refills | Status: DC

## 2021-06-02 MED ORDER — AZITHROMYCIN 250 MG PO TABS: ORAL_TABLET | ORAL | 0 refills | Status: DC

## 2021-06-02 NOTE — ED Provider Notes (Signed)
?[place] ? ? ?MRN: [id number] DOB: Aug 08, 1950 ? ?Subjective:  ? ?Ruth Mendoza is a 71 y.o. female presenting for recheck on her symptoms.  Patient was seen 05/31/2021.  At the time she had had 2 to 3-day history of persistent coughing and sinus congestion, throat pain.  Testing was deferred for COVID as they had already tested and was negative.  She had slight improvement with the medications prescribed.  Unfortunately yesterday she developed a fever that was difficult to control and then woke up with chest pain in the mid lower side.  She is not a smoker.  No history of respiratory disorders. ? ?No current facility-administered medications for this encounter. ? ?Current Outpatient Medications:  ?  benzonatate (TESSALON) 100 MG capsule, Take 1-2 capsules (100-200 mg total) by mouth 3 (three) times daily as needed for cough., Disp: 60 capsule, Rfl: 0 ?  ibuprofen (ADVIL,MOTRIN) 800 MG tablet, Take 1 tablet (800 mg total) by mouth 3 (three) times daily. (Patient not taking: Reported on 11/13/2017), Disp: 21 tablet, Rfl: 0 ?  levocetirizine (XYZAL) 5 MG tablet, Take 1 tablet (5 mg total) by mouth every evening., Disp: 30 tablet, Rfl: 0 ?  promethazine-dextromethorphan (PROMETHAZINE-DM) 6.25-15 MG/5ML syrup, Take 5 mLs by mouth at bedtime as needed for cough., Disp: 100 mL, Rfl: 0 ?  pseudoephedrine (SUDAFED) 60 MG tablet, Take 1 tablet (60 mg total) by mouth every 8 (eight) hours as needed for congestion., Disp: 30 tablet, Rfl: 0  ? ?Allergies  ?Allergen Reactions  ? Codeine Nausea And Vomiting  ? ? ?Past Medical History:  ?Diagnosis Date  ? PONV (postoperative nausea and vomiting)   ?  ? ?Past Surgical History:  ?Procedure Laterality Date  ? ABDOMINAL HYSTERECTOMY    ? BREAST BIOPSY    ? BREAST CYST EXCISION Left 30 yrs ago  ? BREAST SURGERY    ? CHOLECYSTECTOMY    ? COLONOSCOPY N/A 11/18/2017  ? Procedure: COLONOSCOPY;  Surgeon: Rogene Houston, MD;  [facility];  Service:  Endoscopy;  Laterality: N/A;  930  ? POLYPECTOMY  11/18/2017  ? Procedure: POLYPECTOMY;  Surgeon: Rogene Houston, MD;  Location: [facility];  Service: Endoscopy;;  splenic flexure polyp cs, hepatic flexure polyp cs ascending colon polyp hs, distal sigmoid colon polyp cs, rectal polyp cs  ? TONSILLECTOMY    ? ? ?Family History  ?Problem Relation Age of Onset  ? Breast cancer Paternal Grandmother   ? Colon cancer Neg Hx   ? ? ?Social History  ? ?Tobacco Use  ? Smoking status: Never  ? Smokeless tobacco: Never  ?Vaping Use  ? Vaping Use: Never used  ?Substance Use Topics  ? Drug use: Never  ? ? ?ROS ? ? ?Objective:  ? ?Vitals: ?BP 120/83 (BP Location: Right Arm)   Pulse (!) 110   Temp 99.4 ?F (37.4 ?C) (Oral)   Resp 18   SpO2 96%  ? ?Pulse recheck ranged from 105-107bpm.  ? ?Physical Exam ?Constitutional:   ?   General: She is not in acute distress. ?   Appearance: Normal appearance. She is well-developed. She is not ill-appearing, toxic-appearing or diaphoretic.  ?HENT:  ?   Head: Normocephalic and atraumatic.  ?   Nose: Nose normal.  ?   Mouth/Throat:  ?   Mouth: Mucous membranes are moist.  ?Eyes:  ?   General: No scleral icterus.    ?   Right eye: No discharge.     ?  Left eye: No discharge.  ?   Extraocular Movements: Extraocular movements intact.  ?Cardiovascular:  ?   Rate and Rhythm: Normal rate.  ?   Heart sounds: No murmur heard. ?  No friction rub. No gallop.  ?Pulmonary:  ?   Effort: Pulmonary effort is normal. No respiratory distress.  ?   Breath sounds: No stridor. Examination of the right-lower field reveals rales. Rales present. No wheezing or rhonchi.  ?Chest:  ?   Chest wall: No tenderness.  ?Skin: ?   General: Skin is warm and dry.  ?Neurological:  ?   General: No focal deficit present.  ?   Mental Status: She is alert and oriented to person, place, and time.  ?Psychiatric:     ?   Mood and Affect: Mood normal.     ?   Behavior: Behavior normal.  ? ? ?DG Chest 2 View ? ?Result Date:  06/02/2021 ?CLINICAL DATA:  71 year old female with history of fever and cough for the past 2 days. EXAM: CHEST - 2 VIEW COMPARISON:  Chest x-ray 09/19/2013. FINDINGS: Ill-defined opacity projecting over the anterior aspect of the lower lobes on the lateral view, not well localized on the frontal projection, concerning for bronchopneumonia. No pleural effusions. No pneumothorax. No evidence of pulmonary edema. Heart size is normal. Upper mediastinal contours are within normal limits. Atherosclerotic calcifications in the thoracic aorta. Surgical clips project over the right upper quadrant of the abdomen, likely from prior cholecystectomy. IMPRESSION: 1. Findings are concerning for bronchopneumonia in the lower lobe(s) of the lungs, not well localized on the frontal projection. Followup PA and lateral chest X-ray is recommended in 3-4 weeks following trial of antibiotic therapy to ensure resolution and exclude underlying malignancy. Electronically Signed   By: Vinnie Langton M.D.   On: 06/02/2021 08:41   ? ? ?Assessment and Plan :  ? ?PDMP not reviewed this encounter. ? ?1. Bronchopneumonia   ?2. Atypical chest pain   ?3. Acute cough   ? ?Recommended double therapy with amoxicillin and azithromycin.  Continue with supportive care otherwise.  Follow-up in 3 to 4 weeks. Counseled patient on potential for adverse effects with medications prescribed/recommended today, ER and return-to-clinic precautions discussed, patient verbalized understanding. ? ?  ?Jaynee Eagles, PA-C ?06/02/21 [id number] ? ?

## 2021-06-02 NOTE — ED Triage Notes (Signed)
Fever, cough that started 2 days ago.  States she is not feeling any better.  Home covid test was negative last night.  Productive cough with yellow sputum. ?

## 2021-06-11 ENCOUNTER — Ambulatory Visit (INDEPENDENT_AMBULATORY_CARE_PROVIDER_SITE_OTHER): Payer: Medicare PPO

## 2021-06-11 ENCOUNTER — Other Ambulatory Visit: Payer: Self-pay

## 2021-06-11 ENCOUNTER — Ambulatory Visit
Admission: EM | Admit: 2021-06-11 | Discharge: 2021-06-11 | Disposition: A | Discharge status: Home / Self Care | Payer: Medicare PPO | Attending: Urgent Care | Admitting: Urgent Care

## 2021-06-11 DIAGNOSIS — R918 Other nonspecific abnormal finding of lung field: Secondary | ICD-10-CM | POA: Diagnosis not present

## 2021-06-11 DIAGNOSIS — J180 Bronchopneumonia, unspecified organism: Secondary | ICD-10-CM

## 2021-06-11 DIAGNOSIS — R0789 Other chest pain: Secondary | ICD-10-CM

## 2021-06-11 DIAGNOSIS — R0602 Shortness of breath: Secondary | ICD-10-CM

## 2021-06-11 DIAGNOSIS — J189 Pneumonia, unspecified organism: Secondary | ICD-10-CM | POA: Diagnosis not present

## 2021-06-11 MED ORDER — LEVOFLOXACIN 750 MG PO TABS: 750.0000 mg | ORAL_TABLET | Freq: Every day | ORAL | 0 refills | Status: DC

## 2021-06-11 NOTE — ED Provider Notes (Signed)
?[place] ? ? ?MRN: [id number] DOB: 09-18-1950 ? ?Subjective:  ? ?Ruth Mendoza is a 71 y.o. female presenting for recheck on her shortness of breath.  At her last office visit on 06/02/2021, she was diagnosed with bronchopneumonia.  Patient was started on amoxicillin and azithromycin for double coverage.  Felt like she was doing better but then today she started feeling short of breath again and had pleuritic pain.  Wanted to make sure that her lungs were clear.  No fevers, body aches, hemoptysis. ? ?No current facility-administered medications for this encounter. ? ?Current Outpatient Medications:  ?  amoxicillin (AMOXIL) 500 MG capsule, Take 2 capsules (1,000 mg total) by mouth 3 (three) times daily for 10 days., Disp: 60 capsule, Rfl: 0 ?  azithromycin (ZITHROMAX) 250 MG tablet, Day 1: take 2 tablets. Day 2-5: Take 1 tablet daily., Disp: 6 tablet, Rfl: 0 ?  benzonatate (TESSALON) 100 MG capsule, Take 1-2 capsules (100-200 mg total) by mouth 3 (three) times daily as needed for cough., Disp: 60 capsule, Rfl: 0 ?  ibuprofen (ADVIL,MOTRIN) 800 MG tablet, Take 1 tablet (800 mg total) by mouth 3 (three) times daily. (Patient not taking: Reported on 11/13/2017), Disp: 21 tablet, Rfl: 0 ?  levocetirizine (XYZAL) 5 MG tablet, Take 1 tablet (5 mg total) by mouth every evening., Disp: 30 tablet, Rfl: 0 ?  promethazine-dextromethorphan (PROMETHAZINE-DM) 6.25-15 MG/5ML syrup, Take 5 mLs by mouth at bedtime as needed for cough., Disp: 100 mL, Rfl: 0 ?  pseudoephedrine (SUDAFED) 60 MG tablet, Take 1 tablet (60 mg total) by mouth every 8 (eight) hours as needed for congestion., Disp: 30 tablet, Rfl: 0  ? ?Allergies  ?Allergen Reactions  ? Codeine Nausea And Vomiting  ? ? ?Past Medical History:  ?Diagnosis Date  ? PONV (postoperative nausea and vomiting)   ?  ? ?Past Surgical History:  ?Procedure Laterality Date  ? ABDOMINAL HYSTERECTOMY    ? BREAST BIOPSY    ? BREAST CYST EXCISION Left 30 yrs ago  ? BREAST  SURGERY    ? CHOLECYSTECTOMY    ? COLONOSCOPY N/A 11/18/2017  ? Procedure: COLONOSCOPY;  Surgeon: Rogene Houston, MD;  Location: [facility];  Service: Endoscopy;  Laterality: N/A;  930  ? POLYPECTOMY  11/18/2017  ? Procedure: POLYPECTOMY;  Surgeon: Rogene Houston, MD;  Location: [facility];  Service: Endoscopy;;  splenic flexure polyp cs, hepatic flexure polyp cs ascending colon polyp hs, distal sigmoid colon polyp cs, rectal polyp cs  ? TONSILLECTOMY    ? ? ?Family History  ?Problem Relation Age of Onset  ? Breast cancer Paternal Grandmother   ? Colon cancer Neg Hx   ? ? ?Social History  ? ?Tobacco Use  ? Smoking status: Never  ? Smokeless tobacco: Never  ?Vaping Use  ? Vaping Use: Never used  ?Substance Use Topics  ? Drug use: Never  ? ? ?ROS ? ? ?Objective:  ? ?Vitals: ?BP 107/73   Pulse 100   Temp 97.9 ?F (36.6 ?C)   Resp 20   SpO2 96%  ? ?Wt Readings from Last 3 Encounters:  ?05/31/21 200 lb (90.7 kg)  ?11/18/17 180 lb (81.6 kg)  ?10/19/13 213 lb (96.6 kg)  ? ?Temp Readings from Last 3 Encounters:  ?06/11/21 97.9 ?F (36.6 ?C)  ?06/02/21 99.4 ?F (37.4 ?C) (Oral)  ?05/31/21 98 ?F (36.7 ?C) (Oral)  ? ?BP Readings from Last 3 Encounters:  ?06/11/21 107/73  ?06/02/21 120/83  ?05/31/21 132/85  ? ?  Pulse Readings from Last 3 Encounters:  ?06/11/21 100  ?06/02/21 (!) 110  ?05/31/21 87  ? ?Physical Exam ?Constitutional:   ?   General: She is not in acute distress. ?   Appearance: Normal appearance. She is well-developed. She is not ill-appearing, toxic-appearing or diaphoretic.  ?HENT:  ?   Head: Normocephalic and atraumatic.  ?   Nose: Nose normal.  ?   Mouth/Throat:  ?   Mouth: Mucous membranes are moist.  ?Eyes:  ?   General: No scleral icterus.    ?   Right eye: No discharge.     ?   Left eye: No discharge.  ?   Extraocular Movements: Extraocular movements intact.  ?Cardiovascular:  ?   Rate and Rhythm: Normal rate.  ?   Heart sounds: No murmur heard. ?  No friction rub. No gallop.  ?Pulmonary:  ?    Effort: Pulmonary effort is normal. No respiratory distress.  ?   Breath sounds: No stridor. No wheezing, rhonchi or rales.  ?Chest:  ?   Chest wall: No tenderness.  ?Skin: ?   General: Skin is warm and dry.  ?Neurological:  ?   General: No focal deficit present.  ?   Mental Status: She is alert and oriented to person, place, and time.  ?Psychiatric:     ?   Mood and Affect: Mood normal.     ?   Behavior: Behavior normal.  ? ?DG Chest 2 View ? ?Result Date: 06/11/2021 ?CLINICAL DATA:  Shortness of breath with atypical chest pain. EXAM: CHEST - 2 VIEW COMPARISON:  06/02/2021 FINDINGS: Patchy opacity overlies the posterior lung bases on the lateral film, likely in the right lower lobe. No pulmonary edema or pleural effusion. The cardiopericardial silhouette is within normal limits for size. The visualized bony structures of the thorax are unremarkable. IMPRESSION: Patchy opacity overlying the posterior right lower lobe on the lateral film raises the question of right lower lobe pneumonia. Electronically Signed   By: Misty Stanley M.D.   On: 06/11/2021 08:36   ? ? ?DG Chest 2 View ? ?Result Date: 06/02/2021 ?CLINICAL DATA:  71 year old female with history of fever and cough for the past 2 days. EXAM: CHEST - 2 VIEW COMPARISON:  Chest x-ray 09/19/2013. FINDINGS: Ill-defined opacity projecting over the anterior aspect of the lower lobes on the lateral view, not well localized on the frontal projection, concerning for bronchopneumonia. No pleural effusions. No pneumothorax. No evidence of pulmonary edema. Heart size is normal. Upper mediastinal contours are within normal limits. Atherosclerotic calcifications in the thoracic aorta. Surgical clips project over the right upper quadrant of the abdomen, likely from prior cholecystectomy. IMPRESSION: 1. Findings are concerning for bronchopneumonia in the lower lobe(s) of the lungs, not well localized on the frontal projection. Followup PA and lateral chest X-ray is recommended in  3-4 weeks following trial of antibiotic therapy to ensure resolution and exclude underlying malignancy. Electronically Signed   By: Vinnie Langton M.D.   On: 06/02/2021 08:41   ? ? ?Assessment and Plan :  ? ?PDMP not reviewed this encounter. ? ?1. Pneumonia of right lower lobe due to infectious organism   ?2. Bronchopneumonia   ?3. Shortness of breath   ?4. Atypical chest pain   ? ?Had an extensive discussion with patient about treatment options which include 1 more round of an antibiotic versus an ER visit for consideration of hospitalization for persistent pneumonia.  Ultimately we arrived at doing 1 more round with  levofloxacin.  Recommended that she start a probiotic.  Follow-up with her PCP for consideration of a chest CT scan as an outpatient if symptoms fail to improve.  She is hemodynamically stable and therefore I was agreeable to continuing outpatient management.  However, she will maintain strict ER precautions.  Use supportive care otherwise. Counseled patient on potential for adverse effects with medications prescribed/recommended today, ER and return-to-clinic precautions discussed, patient verbalized understanding. ? ?  ?Jaynee Eagles, PA-C ?06/11/21 0848 ? ?

## 2021-06-11 NOTE — ED Triage Notes (Signed)
Pt presents with continued sob from last visit  ?

## 2021-06-12 ENCOUNTER — Encounter (HOSPITAL_COMMUNITY): Payer: Self-pay

## 2021-06-12 ENCOUNTER — Other Ambulatory Visit: Payer: Self-pay

## 2021-06-12 ENCOUNTER — Emergency Department (HOSPITAL_COMMUNITY): Payer: Medicare PPO

## 2021-06-12 ENCOUNTER — Emergency Department (HOSPITAL_COMMUNITY)
Admission: EM | Admit: 2021-06-12 | Discharge: 2021-06-12 | Disposition: A | Discharge status: Home / Self Care | Payer: Medicare PPO | Attending: Emergency Medicine | Admitting: Emergency Medicine

## 2021-06-12 DIAGNOSIS — R7989 Other specified abnormal findings of blood chemistry: Secondary | ICD-10-CM | POA: Diagnosis not present

## 2021-06-12 DIAGNOSIS — R0789 Other chest pain: Secondary | ICD-10-CM | POA: Diagnosis not present

## 2021-06-12 DIAGNOSIS — R079 Chest pain, unspecified: Secondary | ICD-10-CM | POA: Diagnosis not present

## 2021-06-12 DIAGNOSIS — Z7982 Long term (current) use of aspirin: Secondary | ICD-10-CM | POA: Insufficient documentation

## 2021-06-12 DIAGNOSIS — J40 Bronchitis, not specified as acute or chronic: Secondary | ICD-10-CM | POA: Diagnosis not present

## 2021-06-12 LAB — CBC
HCT: 51.6 % — ABNORMAL HIGH (ref 36.0–46.0)
Hemoglobin: 16.5 g/dL — ABNORMAL HIGH (ref 12.0–15.0)
MCH: 29.3 pg (ref 26.0–34.0)
MCHC: 32 g/dL (ref 30.0–36.0)
MCV: 91.7 fL (ref 80.0–100.0)
Platelets: 291 10*3/uL (ref 150–400)
RBC: 5.63 MIL/uL — ABNORMAL HIGH (ref 3.87–5.11)
RDW: 12.6 % (ref 11.5–15.5)
WBC: 6.1 10*3/uL (ref 4.0–10.5)
nRBC: 0 % (ref 0.0–0.2)

## 2021-06-12 LAB — BASIC METABOLIC PANEL
Anion gap: 5 (ref 5–15)
BUN: 30 mg/dL — ABNORMAL HIGH (ref 8–23)
CO2: 24 mmol/L (ref 22–32)
Calcium: 9.1 mg/dL (ref 8.9–10.3)
Chloride: 107 mmol/L (ref 98–111)
Creatinine, Ser: 1.28 mg/dL — ABNORMAL HIGH (ref 0.44–1.00)
GFR, Estimated: 45 mL/min — ABNORMAL LOW (ref >60)
Glucose, Bld: 80 mg/dL (ref 70–99)
Potassium: 4.3 mmol/L (ref 3.5–5.1)
Sodium: 136 mmol/L (ref 135–145)

## 2021-06-12 LAB — TROPONIN I (HIGH SENSITIVITY)
Troponin I (High Sensitivity): 2 ng/L (ref <18)
Troponin I (High Sensitivity): <2 ng/L (ref <18)

## 2021-06-12 MED ORDER — PREDNISONE 20 MG PO TABS: 40.0000 mg | ORAL_TABLET | Freq: Every day | ORAL | 0 refills | Status: DC

## 2021-06-12 MED ORDER — SODIUM CHLORIDE 0.9 % IV BOLUS
1000.0000 mL | Freq: Once | INTRAVENOUS | Status: AC
  Administered 2021-06-12: 1000 mL via INTRAVENOUS

## 2021-06-12 MED ORDER — IOHEXOL 350 MG/ML SOLN
100.0000 mL | Freq: Once | INTRAVENOUS | Status: AC | PRN
  Administered 2021-06-12: 75 mL via INTRAVENOUS

## 2021-06-12 NOTE — Discharge Instructions (Signed)
As discussed, I recommend that you discontinue the Levaquin and the decongestant medications.  You may start the prednisone prescription this afternoon.  Take as directed until finished.  You may continue the Tessalon if needed for cough.  Also, your kidney functions today show that you were likely dehydrated.  I recommend that you drink plenty of water for the next several days.  Please keep your upcoming appointment with your primary care provider as you will need to have your kidney functions rechecked.  Return to the emergency department for any new or worsening symptoms. ?

## 2021-06-12 NOTE — ED Triage Notes (Signed)
Pt presents to ED with complaints of pressure in her chest since this morning. Pt diagnosed with pneumonia on 3/3. Pt states her heart started racing last night and then started having pressure this morning ?

## 2021-06-12 NOTE — ED Notes (Signed)
Patient transported to CT 

## 2021-06-16 NOTE — ED Provider Notes (Signed)
?[facility] ?Provider Note ? ? ?CSN: [id number] ?Arrival date & time: 06/12/21  1203 ? ?  ? ?History ? ?Chief Complaint  ?Patient presents with  ? Chest Pain  ? ? ?Ruth Mendoza is a 71 y.o. female. ? ? ?Chest Pain ?Associated symptoms: palpitations   ?Associated symptoms: no cough and no shortness of breath   ? ?  ? ? ?Ruth Mendoza is a 71 y.o. female who presents to the Emergency Department complaining of chest pressure for several hours.  She was diagnosed with PNA at [facility] on 05/31/21 has completed a course of amoxil w/o relief.  Seen again at [facility] and prescribed Levaquin which she is currently taking.  She continues to have feelings of heart racing and now having pressure of her left chest.  Denies fever, cough and shortness of breath.   ? ? ?Home Medications ?Prior to Admission medications   ?Medication Sig Start Date End Date Taking? Authorizing Provider  ?aspirin EC 81 MG tablet Take 162 mg by mouth daily. Swallow whole.   Yes [provider]  ?benzonatate (TESSALON) 100 MG capsule Take 1-2 capsules (100-200 mg total) by mouth 3 (three) times daily as needed for cough. 05/31/21  Yes Jaynee Eagles, PA-C  ?ezetimibe (ZETIA) 10 MG tablet Take 10 mg by mouth daily. 03/14/21  Yes [provider]  ?predniSONE (DELTASONE) 20 MG tablet Take 2 tablets (40 mg total) by mouth daily. 06/12/21  Yes Desere Gwin, PA-C  ?ibuprofen (ADVIL,MOTRIN) 800 MG tablet Take 1 tablet (800 mg total) by mouth 3 (three) times daily. ?Patient not taking: Reported on 11/13/2017 10/19/13   Kem Parkinson, PA-C  ?   ? ?Allergies    ?Codeine   ? ?Review of Systems   ?Review of Systems  ?Respiratory:  Negative for cough and shortness of breath.   ?Cardiovascular:  Positive for chest pain and palpitations. Negative for leg swelling.  ?All other systems reviewed and are negative. ? ?Physical Exam ?Updated Vital Signs ?BP 115/81   Pulse 86   Temp (!) 97.5 ?F (36.4 ?C) (Oral)   Resp (!) 21   Ht '5\' 10"'$  (1.778  m)   Wt 90.7 kg   SpO2 99%   BMI 28.70 kg/m?  ?Physical Exam ?Vitals and nursing note reviewed.  ?Constitutional:   ?   General: She is not in acute distress. ?   Appearance: Normal appearance. She is not ill-appearing or toxic-appearing.  ?HENT:  ?   Mouth/Throat:  ?   Mouth: Mucous membranes are moist.  ?   Pharynx: Oropharynx is clear.  ?Eyes:  ?   Conjunctiva/sclera: Conjunctivae normal.  ?   Pupils: Pupils are equal, round, and reactive to light.  ?Cardiovascular:  ?   Rate and Rhythm: Normal rate and regular rhythm.  ?   Heart sounds: Normal heart sounds.  ?Pulmonary:  ?   Effort: Pulmonary effort is normal.  ?   Breath sounds: Normal breath sounds. No wheezing or rhonchi.  ?Abdominal:  ?   Palpations: Abdomen is soft.  ?   Tenderness: There is no abdominal tenderness. There is no guarding or rebound.  ?Musculoskeletal:     ?   General: Normal range of motion.  ?   Cervical back: Normal range of motion.  ?   Right lower leg: No edema.  ?   Left lower leg: No edema.  ?Skin: ?   General: Skin is warm.  ?   Capillary Refill: Capillary refill takes less than  2 seconds.  ?   Findings: No rash.  ?Neurological:  ?   General: No focal deficit present.  ?   Mental Status: She is alert.  ?   Sensory: No sensory deficit.  ?   Motor: No weakness.  ? ? ?ED Results / Procedures / Treatments   ?Labs ?(all labs ordered are listed, but only abnormal results are displayed) ?Labs Reviewed  ?BASIC METABOLIC PANEL - Abnormal; Notable for the following components:  ?    Result Value  ? BUN 30 (*)   ? Creatinine, Ser 1.28 (*)   ? GFR, Estimated 45 (*)   ? All other components within normal limits  ?CBC - Abnormal; Notable for the following components:  ? RBC 5.63 (*)   ? Hemoglobin 16.5 (*)   ? HCT 51.6 (*)   ? All other components within normal limits  ?TROPONIN I (HIGH SENSITIVITY)  ?TROPONIN I (HIGH SENSITIVITY)  ? ? ?EKG ?EKG Interpretation ? ?Date/Time:  Wednesday June 12 2021 12:11:56 EDT ?Ventricular Rate:  109 ?PR  Interval:  126 ?QRS Duration: 92 ?QT Interval:  332 ?QTC Calculation: 447 ?R Axis:   -5 ?Text Interpretation: Sinus tachycardia Otherwise normal ECG When compared with ECG of 24-Jun-2005 05:57, Vent. rate has increased BY  48 BPM Since last tracing rate slower Confirmed by Dorie Rank [id number]) on 06/12/2021 12:16:13 PM ? ?Radiology ?DG Chest 2 View ? ?Result Date: 06/12/2021 ?CLINICAL DATA:  Chest pain. EXAM: CHEST - 2 VIEW COMPARISON:  06/11/2021 FINDINGS: The heart size and mediastinal contours are within normal limits. No further evidence to suggest right lower lobe pneumonia by chest x-ray. There is no evidence of pulmonary edema, consolidation, pneumothorax, nodule or pleural fluid. The visualized skeletal structures are unremarkable. IMPRESSION: No active cardiopulmonary disease. Electronically Signed   By: Aletta Edouard M.D.   On: 06/12/2021 12:47  ? ?DG Chest 2 View ? ?Result Date: 06/11/2021 ?CLINICAL DATA:  Shortness of breath with atypical chest pain. EXAM: CHEST - 2 VIEW COMPARISON:  06/02/2021 FINDINGS: Patchy opacity overlies the posterior lung bases on the lateral film, likely in the right lower lobe. No pulmonary edema or pleural effusion. The cardiopericardial silhouette is within normal limits for size. The visualized bony structures of the thorax are unremarkable. IMPRESSION: Patchy opacity overlying the posterior right lower lobe on the lateral film raises the question of right lower lobe pneumonia. Electronically Signed   By: Misty Stanley M.D.   On: 06/11/2021 08:36  ? ?DG Chest 2 View ? ?Result Date: 06/02/2021 ?CLINICAL DATA:  71 year old female with history of fever and cough for the past 2 days. EXAM: CHEST - 2 VIEW COMPARISON:  Chest x-ray 09/19/2013. FINDINGS: Ill-defined opacity projecting over the anterior aspect of the lower lobes on the lateral view, not well localized on the frontal projection, concerning for bronchopneumonia. No pleural effusions. No pneumothorax. No evidence of  pulmonary edema. Heart size is normal. Upper mediastinal contours are within normal limits. Atherosclerotic calcifications in the thoracic aorta. Surgical clips project over the right upper quadrant of the abdomen, likely from prior cholecystectomy. IMPRESSION: 1. Findings are concerning for bronchopneumonia in the lower lobe(s) of the lungs, not well localized on the frontal projection. Followup PA and lateral chest X-ray is recommended in 3-4 weeks following trial of antibiotic therapy to ensure resolution and exclude underlying malignancy. Electronically Signed   By: Vinnie Langton M.D.   On: 06/02/2021 08:41  ? ?CT Angio Chest PE W and/or Wo Contrast ? ?Result Date:  06/12/2021 ?CLINICAL DATA:  Chest pain EXAM: CT ANGIOGRAPHY CHEST WITH CONTRAST TECHNIQUE: Multidetector CT imaging of the chest was performed using the standard protocol during bolus administration of intravenous contrast. Multiplanar CT image reconstructions and MIPs were obtained to evaluate the vascular anatomy. RADIATION DOSE REDUCTION: This exam was performed according to the departmental dose-optimization program which includes automated exposure control, adjustment of the mA and/or kV according to patient size and/or use of iterative reconstruction technique. CONTRAST:  44m OMNIPAQUE IOHEXOL 350 MG/ML SOLN COMPARISON:  None. FINDINGS: Cardiovascular: Adequate contrast opacification of the pulmonary arteries no evidence of pulmonary embolus. Normal heart size. No pericardial effusion. Atherosclerotic disease of the thoracic aorta. Mediastinum/Nodes: Esophagus and thyroid are unremarkable. No pathologically enlarged lymph nodes seen in the chest. Lungs/Pleura: Central airways are patent. Mild bilateral bronchial wall thickening. Mild bilateral ground-glass opacities, likely atelectasis related to expiratory phase of imaging. No consolidation, pleural effusion or pneumothorax. Upper Abdomen: Cholecystectomy clips.  No acute abnormality.  Musculoskeletal: No chest wall abnormality. No acute or significant osseous findings. Review of the MIP images confirms the above findings. IMPRESSION: 1. No evidence of pulmonary embolus. 2. Mild bilateral bronchial wall thic

## 2021-06-18 DIAGNOSIS — Z6829 Body mass index (BMI) 29.0-29.9, adult: Secondary | ICD-10-CM | POA: Diagnosis not present

## 2021-06-18 DIAGNOSIS — J189 Pneumonia, unspecified organism: Secondary | ICD-10-CM | POA: Diagnosis not present

## 2021-06-18 DIAGNOSIS — E663 Overweight: Secondary | ICD-10-CM | POA: Diagnosis not present

## 2021-06-25 ENCOUNTER — Other Ambulatory Visit: Payer: Self-pay

## 2021-06-25 ENCOUNTER — Encounter (HOSPITAL_COMMUNITY): Payer: Self-pay | Admitting: *Deleted

## 2021-06-25 ENCOUNTER — Emergency Department (HOSPITAL_COMMUNITY): Payer: Medicare PPO

## 2021-06-25 ENCOUNTER — Emergency Department (HOSPITAL_COMMUNITY)
Admission: EM | Admit: 2021-06-25 | Discharge: 2021-06-25 | Disposition: A | Discharge status: Home / Self Care | Payer: Medicare PPO | Attending: Emergency Medicine | Admitting: Emergency Medicine

## 2021-06-25 DIAGNOSIS — R21 Rash and other nonspecific skin eruption: Secondary | ICD-10-CM | POA: Diagnosis present

## 2021-06-25 DIAGNOSIS — I809 Phlebitis and thrombophlebitis of unspecified site: Secondary | ICD-10-CM

## 2021-06-25 DIAGNOSIS — Z7982 Long term (current) use of aspirin: Secondary | ICD-10-CM | POA: Diagnosis not present

## 2021-06-25 DIAGNOSIS — I808 Phlebitis and thrombophlebitis of other sites: Secondary | ICD-10-CM | POA: Diagnosis not present

## 2021-06-25 DIAGNOSIS — Z872 Personal history of diseases of the skin and subcutaneous tissue: Secondary | ICD-10-CM | POA: Diagnosis not present

## 2021-06-25 DIAGNOSIS — I82612 Acute embolism and thrombosis of superficial veins of left upper extremity: Secondary | ICD-10-CM | POA: Diagnosis not present

## 2021-06-25 DIAGNOSIS — R2232 Localized swelling, mass and lump, left upper limb: Secondary | ICD-10-CM | POA: Diagnosis not present

## 2021-06-25 HISTORY — DX: Pure hypercholesterolemia, unspecified: E78.00

## 2021-06-25 NOTE — ED Triage Notes (Signed)
Pt c/o left upper arm redness and pain that started 2 days ago. Pt reports she was here in the hospital a week ago and had her IV in the left antecubital area. Pt is concerned she may have a blood clot in her arm from the IV.  ?

## 2021-06-25 NOTE — ED Provider Notes (Signed)
?[facility] ?Provider Note ? ? ?CSN: [id number] ?Arrival date & time: 06/25/21  6599 ? ?  ? ?History ? ?Chief Complaint  ?Patient presents with  ? Rash  ? ? ?Ruth Mendoza is a 71 y.o. female. ? ?HPI ?Patient without significant medical history presents with complaints of left arm pain concern for possible blood clot as she had an IV inserted in her left antecubital area during her hospitalization prior to the rash. She states the rash is located on her anterior upper left extremity and is painful, red, warm to touch, tight, and with an associated knot. Rash is not itchy and patient denies any changes to her detergent or skin care. She denies any history of prior PE/DVT, ever smoking, hormone use.  She denies any paresthesia or weakness moving down her left arm, she is move her fingers wrist and elbow joint without difficulty.  She is not immunocompromise.  ? ? ? ?  ? ?Home Medications ?Prior to Admission medications   ?Medication Sig Start Date End Date Taking? Authorizing Provider  ?aspirin EC 81 MG tablet Take 162 mg by mouth daily. Swallow whole.   Yes [provider]  ?ezetimibe (ZETIA) 10 MG tablet Take 10 mg by mouth daily. 03/14/21  Yes [provider]  ?benzonatate (TESSALON) 100 MG capsule Take 1-2 capsules (100-200 mg total) by mouth 3 (three) times daily as needed for cough. ?Patient not taking: Reported on 06/25/2021 05/31/21   Jaynee Eagles, PA-C  ?ibuprofen (ADVIL,MOTRIN) 800 MG tablet Take 1 tablet (800 mg total) by mouth 3 (three) times daily. ?Patient not taking: Reported on 11/13/2017 10/19/13   Kem Parkinson, PA-C  ?predniSONE (DELTASONE) 20 MG tablet Take 2 tablets (40 mg total) by mouth daily. ?Patient not taking: Reported on 06/25/2021 06/12/21   Kem Parkinson, PA-C  ?   ? ?Allergies    ?Codeine   ? ?Review of Systems   ?Review of Systems  ?Constitutional:  Negative for chills and fever.  ?Respiratory:  Negative for shortness of breath.   ?Cardiovascular:   Negative for chest pain.  ?Gastrointestinal:  Negative for abdominal pain.  ?Musculoskeletal:   ?     Left arm pain  ?Skin:  Positive for rash.  ?Neurological:  Negative for headaches.  ? ?Physical Exam ?Updated Vital Signs ?BP 130/88 (BP Location: Right Arm)   Pulse 65   Temp 97.6 ?F (36.4 ?C) (Oral)   Resp 18   Ht '5\' 10"'$  (1.778 m)   Wt 90.7 kg   SpO2 98%   BMI 28.70 kg/m?  ?Physical Exam ?Vitals and nursing note reviewed.  ?Constitutional:   ?   General: She is not in acute distress. ?   Appearance: She is not ill-appearing.  ?HENT:  ?   Head: Normocephalic and atraumatic.  ?   Nose: No congestion.  ?Eyes:  ?   Conjunctiva/sclera: Conjunctivae normal.  ?Cardiovascular:  ?   Rate and Rhythm: Normal rate and regular rhythm.  ?   Pulses: Normal pulses.  ?   Heart sounds: No murmur heard. ?  No friction rub. No gallop.  ?Pulmonary:  ?   Effort: No respiratory distress.  ?   Breath sounds: No wheezing, rhonchi or rales.  ?Musculoskeletal:  ?   Comments: Left arm was visualized she has a small erythematous rash proximal to the left AC, measuring approximately 3 cm in length about 2 mm in width, is slightly warm to the touch small nodule present which is not fixated, tender  to palpation there is no fluctuant induration noted.  She has full range of motion her fingers wrist elbow and shoulder, neurovascular fully intact 2+ radial pulses.  ?Skin: ?   General: Skin is warm and dry.  ?Neurological:  ?   Mental Status: She is alert.  ?Psychiatric:     ?   Mood and Affect: Mood normal.  ? ? ?ED Results / Procedures / Treatments   ?Labs ?(all labs ordered are listed, but only abnormal results are displayed) ?Labs Reviewed - No data to display ? ?EKG ?None ? ?Radiology ?US Venous Img Upper Uni Left ? ?Result Date: 06/25/2021 ?CLINICAL DATA:  Erythema palpable nodule proximal to the left [facility] concern for possible DVT. EXAM: LEFT UPPER EXTREMITY VENOUS DOPPLER ULTRASOUND TECHNIQUE: Gray-scale sonography with graded  compression, as well as color Doppler and duplex ultrasound were performed to evaluate the upper extremity deep venous system from the level of the subclavian vein and including the jugular, axillary, basilic, radial, ulnar and upper cephalic vein. Spectral Doppler was utilized to evaluate flow at rest and with distal augmentation maneuvers. COMPARISON:  None. FINDINGS: Contralateral Subclavian Vein: Respiratory phasicity is normal and symmetric with the symptomatic side. No evidence of thrombus. Normal compressibility. Internal Jugular Vein: No evidence of thrombus. Normal compressibility, respiratory phasicity and response to augmentation. Subclavian Vein: No evidence of thrombus. Normal compressibility, respiratory phasicity and response to augmentation. Axillary Vein: No evidence of thrombus. Normal compressibility, respiratory phasicity and response to augmentation. Cephalic Vein: Occlusive thrombus is visualized with no compressibility. This correlates with the palpable abnormality. Basilic Vein: No evidence of thrombus. Normal compressibility, respiratory phasicity and response to augmentation. Brachial Veins: No evidence of thrombus. Normal compressibility, respiratory phasicity and response to augmentation. Radial Veins: No evidence of thrombus. Normal compressibility, respiratory phasicity and response to augmentation. Ulnar Veins: No evidence of thrombus. Normal compressibility, respiratory phasicity and response to augmentation. IMPRESSION: 1. Palpable abnormality correlates with occlusive thrombus within the left superficial cephalic vein, extending to the distal aspect of the upper arm. 2. No evidence of deep venous thrombosis. Electronically Signed   By: Margaretha Sheffield M.D.   On: 06/25/2021 11:32   ? ?Procedures ?Procedures  ? ? ?Medications Ordered in ED ?Medications - No data to display ? ?ED Course/ Medical Decision Making/ A&P ?  ?                        ?Medical Decision Making ? ?This patient  presents to the ED for concern of rash, this involves an extensive number of treatment options, and is a complaint that carries with it a high risk of complications and morbidity.  The differential diagnosis includes cellulitis, DVT, compartment syndrome ? ? ? ?Additional history obtained: ? ?Additional history obtained from N/A ?External records from outside source obtained and reviewed including N/A ? ? ?Co morbidities that complicate the patient evaluation ? ?N/A ? ?Social Determinants of Health: ? ?Patient is geriatric ? ? ? ?Lab Tests: ? ?I Ordered, and personally interpreted labs.  The pertinent results include: N/A ? ? ?Imaging Studies ordered: ? ?I ordered imaging studies including DVT study ?I independently visualized and interpreted imaging which showed shows superficial thrombosis in the cephalic vein extending to the distal aspect of the upper arm, no evidence of a DVT. ?I agree with the radiologist interpretation ? ? ?Cardiac Monitoring: ? ?The patient was maintained on a cardiac monitor.  I personally viewed and interpreted the cardiac monitored which showed an  underlying rhythm of: N/A ? ? ?Medicines ordered and prescription drug management: ? ?I ordered medication including N/A ?I have reviewed the patients home medicines and have made adjustments as needed ? ?Critical Interventions: ? ?N/A ? ? ?Reevaluation: ? ?Presents with a rash of the left arm after IV insertion, she has a small erythematous rash painful with a small nodule present, concern for possible DVT will obtain study for further evaluation. ? ?Updated on imaging, she is found resting calmly, having no complaints, ready for discharge. ? ?Consultations Obtained: ? ?N/A ? ? ? ?Test Considered: ? ?N/A ? ? ? ?Rule out ?Low suspicion for systemic infection patient nontoxic-appearing vital signs are reassuring.  Low suspicion for cellulitis and/or abscess as presentation is atypical, the rashes proximal to the insertion of the IV site, there is  a nodule present, ultrasound is consistent with superficial thrombosis.  I have low suspicion for compartment syndrome as compartments are soft nontender neurovascular fully intact. ? ? ? ?Dispostion and problem list

## 2021-06-25 NOTE — Discharge Instructions (Signed)
You have a small superficial clot in your left arm, recommend applying warm compresses to the area, and NSAIDs for pain management.  Please follow-up with your primary care provider for further evaluation.  If you start to notice worsening pain redness swelling or redness we have urine please come back in for reevaluation. ? ? ?Come back to the emergency department if you develop chest pain, shortness of breath, severe abdominal pain, uncontrolled nausea, vomiting, diarrhea. ? ?

## 2021-08-02 ENCOUNTER — Encounter: Payer: Self-pay | Admitting: Adult Health

## 2021-08-02 ENCOUNTER — Ambulatory Visit: Payer: Medicare PPO | Admitting: Adult Health

## 2021-08-02 VITALS — BP 116/74 | HR 98 | Ht 68.0 in | Wt 206.0 lb

## 2021-08-02 DIAGNOSIS — L720 Epidermal cyst: Secondary | ICD-10-CM

## 2021-08-02 NOTE — Progress Notes (Signed)
?  Subjective:  ?  ? Patient ID: Ruth Mendoza, female   DOB: 1950-06-15, 71 y.o.   MRN: [id number] ? ?HPI ?Ruth Mendoza is a 71 year old white female, married, sp hysterectomy in complaining of knot between vagina and rectum and tender when wipes. ?PCP is Dr Hilma Favors ? ?Review of Systems ?Has knot between vagina and rectum, it is tender ?Reviewed past medical,surgical, social and family history. Reviewed medications and allergies.  ?   ?Objective:  ? Physical Exam ?BP 116/74 (BP Location: Left Arm, Patient Position: Sitting, Cuff Size: Large)   Pulse 98   Ht '5\' 8"'$  (1.727 m)   Wt 206 lb (93.4 kg)   BMI 31.32 kg/m?   ?  Consent signed for I&D ?Skin warm and dry, as 1-2 cm epidermal cyst in peri area, she wants it remove, cleansed with alcohol swap, and injected with 1.5 cc 2% lidocaine and used #11 blade to make small incision and then squeezed and white cheesy material expressed.less than 0.5 cc blood.  Placed folded  4 x 4 to apply pressure. ?AA is 1 ?Fall risk is low ? ?  08/02/2021  ? 12:14 PM  ?Depression screen PHQ 2/9  ?Decreased Interest 0  ?Down, Depressed, Hopeless 0  ?PHQ - 2 Score 0  ?Altered sleeping 0  ?Tired, decreased energy 0  ?Change in appetite 0  ?Feeling bad or failure about yourself  0  ?Trouble concentrating 0  ?Moving slowly or fidgety/restless 0  ?Suicidal thoughts 0  ?PHQ-9 Score 0  ?  ? ?  08/02/2021  ? 12:15 PM  ?GAD 7 : Generalized Anxiety Score  ?Nervous, Anxious, on Edge 1  ?Control/stop worrying 0  ?Worry too much - different things 1  ?Trouble relaxing 0  ?Restless 1  ?Easily annoyed or irritable 1  ?Afraid - awful might happen 0  ?Total GAD 7 Score 4  ? ? Upstream - 08/02/21 1229   ? ?  ? Pregnancy Intention Screening  ? Does the patient want to become pregnant in the next year? N/A   ? Does the patient's partner want to become pregnant in the next year? N/A   ? Would the patient like to discuss contraceptive options today? N/A   ?  ? Contraception Wrap Up  ? Current Method Female  Sterilization   hyst  ? End Method Female Sterilization   hyst  ? Contraception Counseling Provided No   ? ?  ?  ? ?  ?  ? Examination chaperoned by Levy Pupa LPN ? ?Assessment:  ?   ?1. Epidermal cyst ?I&D performed and cheesy material express ?Can use ice or warm compress prn  ?  Use wet wipes  ?Plan:  ?   ?Follow up prn  ?   ?

## 2021-09-18 DIAGNOSIS — E663 Overweight: Secondary | ICD-10-CM | POA: Diagnosis not present

## 2021-09-18 DIAGNOSIS — Z6829 Body mass index (BMI) 29.0-29.9, adult: Secondary | ICD-10-CM | POA: Diagnosis not present

## 2021-09-18 DIAGNOSIS — R002 Palpitations: Secondary | ICD-10-CM | POA: Diagnosis not present

## 2021-10-13 ENCOUNTER — Ambulatory Visit
Admission: EM | Admit: 2021-10-13 | Discharge: 2021-10-13 | Disposition: A | Discharge status: Home / Self Care | Payer: Medicare PPO | Attending: Family Medicine | Admitting: Family Medicine

## 2021-10-13 ENCOUNTER — Encounter: Payer: Self-pay | Admitting: Emergency Medicine

## 2021-10-13 DIAGNOSIS — J069 Acute upper respiratory infection, unspecified: Secondary | ICD-10-CM | POA: Diagnosis not present

## 2021-10-13 MED ORDER — PROMETHAZINE-DM 6.25-15 MG/5ML PO SYRP: 5.0000 mL | ORAL_SOLUTION | Freq: Four times a day (QID) | ORAL | 0 refills | Status: DC | PRN

## 2021-10-13 MED ORDER — PREDNISONE 20 MG PO TABS
40.0000 mg | ORAL_TABLET | Freq: Every day | ORAL | 0 refills | Status: DC
Start: 2021-10-13 — End: 2022-02-03

## 2021-10-13 MED ORDER — ALBUTEROL SULFATE HFA 108 (90 BASE) MCG/ACT IN AERS: 1.0000 | INHALATION_SPRAY | Freq: Four times a day (QID) | RESPIRATORY_TRACT | 0 refills | Status: DC | PRN

## 2021-10-13 NOTE — ED Triage Notes (Signed)
Patient c/o productive cough, sore throat, nasal drainage x 2 days.  Patient unable to sleep due to cough.  Home COVID test was negative.  Patient has taken Tylenol and Robitussin DM.

## 2021-10-14 LAB — COVID-19, FLU A+B NAA
Influenza A, NAA: NOT DETECTED
Influenza B, NAA: NOT DETECTED
SARS-CoV-2, NAA: NOT DETECTED

## 2021-10-16 NOTE — ED Provider Notes (Signed)
[facility]    CSN: [id number] Arrival date & time: 10/13/21  0800      History   Chief Complaint Chief Complaint  Patient presents with   Cough    HPI Ruth Mendoza is a 71 y.o. female.   Presenting today with 2-day history of cough, sore throat, nasal congestion.  States she is unable to sleep due to the significance of the cough at this point.  Denies fever, chills, chest pain, significant shortness of breath, abdominal pain, nausea vomiting or diarrhea.  Taking Tylenol and Robitussin-DM with minimal relief.  No known sick contacts recently.  No known pertinent chronic medical problems.    Past Medical History:  Diagnosis Date   High cholesterol    PONV (postoperative nausea and vomiting)     Patient Active Problem List   Diagnosis Date Noted   Epidermal cyst 08/02/2021   Special screening for malignant neoplasms, colon 07/30/2017    Past Surgical History:  Procedure Laterality Date   ABDOMINAL HYSTERECTOMY     BREAST BIOPSY     BREAST CYST EXCISION Left 30 yrs ago   BREAST SURGERY     CHOLECYSTECTOMY     COLONOSCOPY N/A 11/18/2017   Procedure: COLONOSCOPY;  Surgeon: Rogene Houston, MD;  Location: [facility];  Service: Endoscopy;  Laterality: N/A;  930   POLYPECTOMY  11/18/2017   Procedure: POLYPECTOMY;  Surgeon: Rogene Houston, MD;  Location: [facility];  Service: Endoscopy;;  splenic flexure polyp cs, hepatic flexure polyp cs ascending colon polyp hs, distal sigmoid colon polyp cs, rectal polyp cs   TONSILLECTOMY      OB History     Gravida  3   Para  3   Term  3   Preterm      AB      Living  3      SAB      IAB      Ectopic      Multiple      Live Births  3            Home Medications    Prior to Admission medications   Medication Sig Start Date End Date Taking? Authorizing Provider  albuterol (VENTOLIN HFA) 108 (90 Base) MCG/ACT inhaler Inhale 1-2 puffs into the lungs every 6 (six) hours as needed for  wheezing or shortness of breath. 10/13/21  Yes Volney American, PA-C  predniSONE (DELTASONE) 20 MG tablet Take 2 tablets (40 mg total) by mouth daily with breakfast. 10/13/21  Yes Volney American, PA-C  promethazine-dextromethorphan (PROMETHAZINE-DM) 6.25-15 MG/5ML syrup Take 5 mLs by mouth 4 (four) times daily as needed. 10/13/21  Yes Volney American, PA-C  aspirin EC 81 MG tablet Take 81 mg by mouth daily. Swallow whole.    [provider]  ezetimibe (ZETIA) 10 MG tablet Take 10 mg by mouth daily. 03/14/21   [provider]  Multiple Vitamins-Minerals (CENTRUM WOMEN PO) Take by mouth.    [provider]    Family History Family History  Problem Relation Age of Onset   Breast cancer Paternal Grandmother    Cancer Maternal Grandmother        stomach   Heart attack Maternal Grandfather    COPD Father    Alzheimer's disease Mother    Other Mother        has place in lung   Atrial fibrillation Mother    Cancer Sister  kidney; kidney was removed   Hypertension Son    Arthritis Daughter    Colon cancer Neg Hx     Social History Social History   Tobacco Use   Smoking status: Never   Smokeless tobacco: Never  Vaping Use   Vaping Use: Never used  Substance Use Topics   Alcohol use: Yes    Comment: occassional   Drug use: Never     Allergies   Codeine   Review of Systems Review of Systems Per HPI  Physical Exam Triage Vital Signs ED Triage Vitals  Enc Vitals Group     BP 10/13/21 0813 (!) 155/82     Pulse Rate 10/13/21 0813 78     Resp 10/13/21 0813 18     Temp 10/13/21 0813 99.1 F (37.3 C)     Temp Source 10/13/21 0813 Oral     SpO2 10/13/21 0813 93 %     Weight 10/13/21 0815 200 lb (90.7 kg)     Height 10/13/21 0815 '5\' 9"'$  (1.753 m)     Head Circumference --      Peak Flow --      Pain Score 10/13/21 0815 0     Pain Loc --      Pain Edu? --      Excl. in [place]? --    No data found.  Updated Vital  Signs BP (!) 155/82 (BP Location: Right Arm)   Pulse 78   Temp 99.1 F (37.3 C) (Oral)   Resp 18   Ht '5\' 9"'$  (1.753 m)   Wt 200 lb (90.7 kg)   SpO2 93%   BMI 29.53 kg/m   Visual Acuity Right Eye Distance:   Left Eye Distance:   Bilateral Distance:    Right Eye Near:   Left Eye Near:    Bilateral Near:     Physical Exam Vitals and nursing note reviewed.  Constitutional:      Appearance: Normal appearance.  HENT:     Head: Atraumatic.     Right Ear: Tympanic membrane and external ear normal.     Left Ear: Tympanic membrane and external ear normal.     Nose: Rhinorrhea present.     Mouth/Throat:     Mouth: Mucous membranes are moist.     Pharynx: Posterior oropharyngeal erythema present.  Eyes:     Extraocular Movements: Extraocular movements intact.     Conjunctiva/sclera: Conjunctivae normal.  Cardiovascular:     Rate and Rhythm: Normal rate and regular rhythm.     Heart sounds: Normal heart sounds.  Pulmonary:     Effort: Pulmonary effort is normal.     Breath sounds: Wheezing present. No rales.  Musculoskeletal:        General: Normal range of motion.     Cervical back: Normal range of motion and neck supple.  Skin:    General: Skin is warm and dry.  Neurological:     Mental Status: She is alert and oriented to person, place, and time.  Psychiatric:        Mood and Affect: Mood normal.        Thought Content: Thought content normal.      UC Treatments / Results  Labs (all labs ordered are listed, but only abnormal results are displayed) Labs Reviewed  COVID-19, FLU A+B NAA   Narrative:    Performed at:  [address] [place], [place]  [id number] Lab Director: Rush Farmer MD, Phone:  [phone]  EKG   Radiology No results found.  Procedures Procedures (including critical care time)  Medications Ordered in UC Medications - No data to display  Initial Impression / Assessment and Plan / UC Course  I have  reviewed the triage vital signs and the nursing notes.  Pertinent labs & imaging results that were available during my care of the patient were reviewed by me and considered in my medical decision making (see chart for details).     Suspect viral upper respiratory infection, vital signs reassuring today, treat with prednisone, albuterol inhaler, Phenergan DM for suspected bronchitis secondary to this viral infection.  COVID and flu testing pending, return for any worsening symptoms.  Final Clinical Impressions(s) / UC Diagnoses   Final diagnoses:  Viral URI with cough   Discharge Instructions   None    ED Prescriptions     Medication Sig Dispense Auth. Provider   promethazine-dextromethorphan (PROMETHAZINE-DM) 6.25-15 MG/5ML syrup Take 5 mLs by mouth 4 (four) times daily as needed. 100 mL Volney American, PA-C   albuterol (VENTOLIN HFA) 108 (90 Base) MCG/ACT inhaler Inhale 1-2 puffs into the lungs every 6 (six) hours as needed for wheezing or shortness of breath. 18 g Volney American, [place]   predniSONE (DELTASONE) 20 MG tablet Take 2 tablets (40 mg total) by mouth daily with breakfast. 10 tablet Volney American, [place]      PDMP not reviewed this encounter.   Volney American, [place] 10/16/21 1941

## 2021-10-18 ENCOUNTER — Ambulatory Visit
Admission: EM | Admit: 2021-10-18 | Discharge: 2021-10-18 | Disposition: A | Discharge status: Home / Self Care | Payer: Medicare PPO | Attending: Nurse Practitioner | Admitting: Nurse Practitioner

## 2021-10-18 ENCOUNTER — Encounter: Payer: Self-pay | Admitting: Emergency Medicine

## 2021-10-18 ENCOUNTER — Other Ambulatory Visit: Payer: Self-pay

## 2021-10-18 DIAGNOSIS — J309 Allergic rhinitis, unspecified: Secondary | ICD-10-CM

## 2021-10-18 DIAGNOSIS — J209 Acute bronchitis, unspecified: Secondary | ICD-10-CM | POA: Diagnosis not present

## 2021-10-18 MED ORDER — AZITHROMYCIN 250 MG PO TABS
250.0000 mg | ORAL_TABLET | Freq: Every day | ORAL | 0 refills | Status: DC
Start: 2021-10-18 — End: 2022-02-03

## 2021-10-18 MED ORDER — CETIRIZINE HCL 10 MG PO TABS: 10.0000 mg | ORAL_TABLET | Freq: Every day | ORAL | 0 refills | Status: DC

## 2021-10-18 MED ORDER — PROMETHAZINE-DM 6.25-15 MG/5ML PO SYRP: 5.0000 mL | ORAL_SOLUTION | Freq: Four times a day (QID) | ORAL | 0 refills | Status: DC | PRN

## 2021-10-18 NOTE — Discharge Instructions (Addendum)
Take medication as prescribed. Increase fluids and allow for plenty of rest. Recommend Tylenol or ibuprofen as needed for pain, fever, or general discomfort. Recommend using a humidifier at bedtime during sleep to help with cough. Sleep elevated on 2 pillows while cough symptoms persist. As discussed, if you are feeling better but continued to have a cough, you may treat the symptoms with over-the-counter cough drops, throat lozenges, and increase fluids.  The cough may linger up to 3 to 4 weeks. Up immediately if you develop wheezing, shortness of breath, difficulty breathing, new onset fever, or other concerns.

## 2021-10-18 NOTE — ED Provider Notes (Signed)
[facility]    CSN: [id number] Arrival date & time: 10/18/21  0801      History   Chief Complaint Chief Complaint  Patient presents with   Cough    HPI Ruth Mendoza is a 71 y.o. female.   The history is provided by the patient.   Patient presents for continued cough.  Patient was seen in this clinic on 10/13/2021 for the same or similar symptoms.  She states that she was prescribed prednisone and a cough medicine for her symptoms.  She states she is finished the medications but symptoms still remain.  She continues to have a productive cough of yellowish/greenish sputum, she now feels short of breath.  She denies fever, chills, headache, sore throat, wheezing, difficulty breathing, or GI symptoms.  She is concerned that her symptoms have now moved to her chest.  She was recently tested for COVID, which was also negative.  Past Medical History:  Diagnosis Date   High cholesterol    PONV (postoperative nausea and vomiting)     Patient Active Problem List   Diagnosis Date Noted   Epidermal cyst 08/02/2021   Special screening for malignant neoplasms, colon 07/30/2017    Past Surgical History:  Procedure Laterality Date   ABDOMINAL HYSTERECTOMY     BREAST BIOPSY     BREAST CYST EXCISION Left 30 yrs ago   BREAST SURGERY     CHOLECYSTECTOMY     COLONOSCOPY N/A 11/18/2017   Procedure: COLONOSCOPY;  Surgeon: Rogene Houston, MD;  [facility];  Service: Endoscopy;  Laterality: N/A;  930   POLYPECTOMY  11/18/2017   Procedure: POLYPECTOMY;  Surgeon: Rogene Houston, MD;  Location: [facility];  Service: Endoscopy;;  splenic flexure polyp cs, hepatic flexure polyp cs ascending colon polyp hs, distal sigmoid colon polyp cs, rectal polyp cs   TONSILLECTOMY      OB History     Gravida  3   Para  3   Term  3   Preterm      AB      Living  3      SAB      IAB      Ectopic      Multiple      Live Births  3            Home  Medications    Prior to Admission medications   Medication Sig Start Date End Date Taking? Authorizing Provider  azithromycin (ZITHROMAX) 250 MG tablet Take 1 tablet (250 mg total) by mouth daily. Take first 2 tablets together, then 1 every day until finished. 10/18/21  Yes Dorina Ribaudo-Warren, Alda Lea, NP  cetirizine (ZYRTEC ALLERGY) 10 MG tablet Take 1 tablet (10 mg total) by mouth daily. 10/18/21  Yes Taha Dimond-Warren, Alda Lea, NP  promethazine-dextromethorphan (PROMETHAZINE-DM) 6.25-15 MG/5ML syrup Take 5 mLs by mouth 4 (four) times daily as needed for cough. 10/18/21  Yes Alphonsus Doyel-Warren, Alda Lea, NP  albuterol (VENTOLIN HFA) 108 (90 Base) MCG/ACT inhaler Inhale 1-2 puffs into the lungs every 6 (six) hours as needed for wheezing or shortness of breath. 10/13/21   Volney American, PA-C  aspirin EC 81 MG tablet Take 81 mg by mouth daily. Swallow whole.    [provider]  ezetimibe (ZETIA) 10 MG tablet Take 10 mg by mouth daily. 03/14/21   [provider]  Multiple Vitamins-Minerals (CENTRUM WOMEN PO) Take by mouth.    [provider]  predniSONE (DELTASONE)  20 MG tablet Take 2 tablets (40 mg total) by mouth daily with breakfast. 10/13/21   Volney American, PA-C    Family History Family History  Problem Relation Age of Onset   Breast cancer Paternal Grandmother    Cancer Maternal Grandmother        stomach   Heart attack Maternal Grandfather    COPD Father    Alzheimer's disease Mother    Other Mother        has place in lung   Atrial fibrillation Mother    Cancer Sister        kidney; kidney was removed   Hypertension Son    Arthritis Daughter    Colon cancer Neg Hx     Social History Social History   Tobacco Use   Smoking status: Never   Smokeless tobacco: Never  Vaping Use   Vaping Use: Never used  Substance Use Topics   Alcohol use: Yes    Comment: occassional   Drug use: Never     Allergies   Codeine   Review of  Systems Review of Systems Per HPI  Physical Exam Triage Vital Signs ED Triage Vitals [10/18/21 0816]  Enc Vitals Group     BP 111/78     Pulse Rate 85     Resp 18     Temp 97.8 F (36.6 C)     Temp Source Oral     SpO2 96 %     Weight      Height      Head Circumference      Peak Flow      Pain Score 0     Pain Loc      Pain Edu?      Excl. in [place]?    No data found.  Updated Vital Signs BP 111/78 (BP Location: Right Arm)   Pulse 85   Temp 97.8 F (36.6 C) (Oral)   Resp 18   SpO2 96%   Visual Acuity Right Eye Distance:   Left Eye Distance:   Bilateral Distance:    Right Eye Near:   Left Eye Near:    Bilateral Near:     Physical Exam Vitals and nursing note reviewed.  Constitutional:      Appearance: Normal appearance. She is well-developed.  HENT:     Head: Normocephalic and atraumatic.     Right Ear: Tympanic membrane, ear canal and external ear normal.     Left Ear: Tympanic membrane, ear canal and external ear normal.     Nose: Nose normal.     Mouth/Throat:     Mouth: Mucous membranes are moist.     Pharynx: Posterior oropharyngeal erythema present. No oropharyngeal exudate.     Comments: + PND  Eyes:     Extraocular Movements: Extraocular movements intact.     Conjunctiva/sclera: Conjunctivae normal.     Pupils: Pupils are equal, round, and reactive to light.  Neck:     Thyroid: No thyromegaly.     Trachea: No tracheal deviation.  Cardiovascular:     Rate and Rhythm: Normal rate and regular rhythm.     Heart sounds: Normal heart sounds.  Pulmonary:     Effort: Pulmonary effort is normal.     Breath sounds: Normal breath sounds.  Abdominal:     General: Bowel sounds are normal. There is no distension.     Palpations: Abdomen is soft.     Tenderness: There is no abdominal tenderness.  Musculoskeletal:  Cervical back: Normal range of motion and neck supple.  Lymphadenopathy:     Cervical: No cervical adenopathy.  Skin:    General: Skin  is warm and dry.  Neurological:     General: No focal deficit present.     Mental Status: She is alert and oriented to person, place, and time.  Psychiatric:        Mood and Affect: Mood normal.        Behavior: Behavior normal.        Thought Content: Thought content normal.        Judgment: Judgment normal.      UC Treatments / Results  Labs (all labs ordered are listed, but only abnormal results are displayed) Labs Reviewed - No data to display  EKG   Radiology No results found.  Procedures Procedures (including critical care time)  Medications Ordered in UC Medications - No data to display  Initial Impression / Assessment and Plan / UC Course  I have reviewed the triage vital signs and the nursing notes.  Pertinent labs & imaging results that were available during my care of the patient were reviewed by me and considered in my medical decision making (see chart for details).  Patient presents for complaints of continued cough that has been present for more than 1 week.  Patient was seen in this clinic approximately 5 days ago and given symptomatic treatment.  She reports today stating symptoms have worsened.  On exam, her lung sounds are clear, she is in no acute distress.  Her vital signs are stable.  Moderate postnasal drainage was seen on her exam.  Will treat patient prophylactically with azithromycin.  Will also prescribe cetirizine and Promethazine DM for her continued cough and allergy symptoms.  Supportive care recommendations were provided to the patient along with strict indications of when to return to this clinic.  Follow-up as needed. Final Clinical Impressions(s) / UC Diagnoses   Final diagnoses:  Acute bronchitis, unspecified organism  Allergic rhinitis, unspecified seasonality, unspecified trigger     Discharge Instructions      Take medication as prescribed. Increase fluids and allow for plenty of rest. Recommend Tylenol or ibuprofen as needed for  pain, fever, or general discomfort. Recommend using a humidifier at bedtime during sleep to help with cough. Sleep elevated on 2 pillows while cough symptoms persist. As discussed, if you are feeling better but continued to have a cough, you may treat the symptoms with over-the-counter cough drops, throat lozenges, and increase fluids.  The cough may linger up to 3 to 4 weeks. Up immediately if you develop wheezing, shortness of breath, difficulty breathing, new onset fever, or other concerns.     ED Prescriptions     Medication Sig Dispense Auth. Provider   azithromycin (ZITHROMAX) 250 MG tablet Take 1 tablet (250 mg total) by mouth daily. Take first 2 tablets together, then 1 every day until finished. 6 tablet Keia Rask-Warren, Alda Lea, NP   cetirizine (ZYRTEC ALLERGY) 10 MG tablet Take 1 tablet (10 mg total) by mouth daily. 30 tablet Searra Carnathan-Warren, Alda Lea, NP   promethazine-dextromethorphan (PROMETHAZINE-DM) 6.25-15 MG/5ML syrup Take 5 mLs by mouth 4 (four) times daily as needed for cough. 118 mL Honore Wipperfurth-Warren, Alda Lea, NP      PDMP not reviewed this encounter.   Tish Men, NP 10/18/21 0830

## 2021-10-18 NOTE — ED Triage Notes (Signed)
Pt reports was seen for same on Sunday and reports finished prednisone px but cough remains.

## 2021-11-11 ENCOUNTER — Ambulatory Visit (INDEPENDENT_AMBULATORY_CARE_PROVIDER_SITE_OTHER): Payer: Medicare PPO | Admitting: Cardiology

## 2021-11-11 ENCOUNTER — Encounter: Payer: Self-pay | Admitting: Cardiology

## 2021-11-11 VITALS — BP 130/80 | HR 78 | Ht 69.0 in | Wt 205.6 lb

## 2021-11-11 DIAGNOSIS — R002 Palpitations: Secondary | ICD-10-CM | POA: Diagnosis not present

## 2021-11-11 DIAGNOSIS — E782 Mixed hyperlipidemia: Secondary | ICD-10-CM

## 2021-11-11 NOTE — Patient Instructions (Signed)
Medication Instructions:  Continue all current medications.  Labwork: none  Testing/Procedures: none  Follow-Up: As needed.    Any Other Special Instructions Will Be Listed Below (If Applicable).  If you need a refill on your cardiac medications before your next appointment, please call your pharmacy.  

## 2021-11-11 NOTE — Progress Notes (Signed)
Clinical Summary Ruth Mendoza is a 71 y.o.female seen as new consult, referred by Dr Hilma Favors for the following medical problems  1.Palpitations - pneumonia/bronchitis in March, prolonged symptoms. Extended medical management including 2 courses of prednisone - palpitations started in April - feeling of heart pounding. Fitbit would tell her HR was 40-150s. - ongoing symptoms May/June. No symptoms July or August - 2cups of coffee. N sodas, no tea. 2 mixed drinks a week       2. History of chest pain - previously seen at [facility] Patient exercise 6 min 13 sec of standard protocol, approximately  9METS. Normal blood pressure response, no symptoms. She achieved  100% of predicted heart rate. At peak heart rate 81m of up  sloping ST depression. IMP. Negative exercise treadmill test  3. Hyperlipidemia - tolerates zetia - reports side effects on statins. Reports muscle aches on multiple statins.  -   Past Medical History:  Diagnosis Date   High cholesterol    PONV (postoperative nausea and vomiting)      Allergies  Allergen Reactions   Codeine Nausea And Vomiting     Current Outpatient Medications  Medication Sig Dispense Refill   albuterol (VENTOLIN HFA) 108 (90 Base) MCG/ACT inhaler Inhale 1-2 puffs into the lungs every 6 (six) hours as needed for wheezing or shortness of breath. 18 g 0   aspirin EC 81 MG tablet Take 81 mg by mouth daily. Swallow whole.     azithromycin (ZITHROMAX) 250 MG tablet Take 1 tablet (250 mg total) by mouth daily. Take first 2 tablets together, then 1 every day until finished. 6 tablet 0   cetirizine (ZYRTEC ALLERGY) 10 MG tablet Take 1 tablet (10 mg total) by mouth daily. 30 tablet 0   ezetimibe (ZETIA) 10 MG tablet Take 10 mg by mouth daily.     Multiple Vitamins-Minerals (CENTRUM WOMEN PO) Take by mouth.     predniSONE (DELTASONE) 20 MG tablet Take 2 tablets (40 mg total) by mouth daily with breakfast. 10 tablet 0    promethazine-dextromethorphan (PROMETHAZINE-DM) 6.25-15 MG/5ML syrup Take 5 mLs by mouth 4 (four) times daily as needed for cough. 118 mL 0   No current facility-administered medications for this visit.     Past Surgical History:  Procedure Laterality Date   ABDOMINAL HYSTERECTOMY     BREAST BIOPSY     BREAST CYST EXCISION Left 30 yrs ago   BREAST SURGERY     CHOLECYSTECTOMY     COLONOSCOPY N/A 11/18/2017   Procedure: COLONOSCOPY;  Surgeon: RRogene Houston MD;  [facility];  Service: Endoscopy;  Laterality: N/A;  930   POLYPECTOMY  11/18/2017   Procedure: POLYPECTOMY;  Surgeon: RRogene Houston MD;  [facility];  Service: Endoscopy;;  splenic flexure polyp cs, hepatic flexure polyp cs ascending colon polyp hs, distal sigmoid colon polyp cs, rectal polyp cs   TONSILLECTOMY       Allergies  Allergen Reactions   Codeine Nausea And Vomiting      Family History  Problem Relation Age of Onset   Breast cancer Paternal Grandmother    Cancer Maternal Grandmother        stomach   Heart attack Maternal Grandfather    COPD Father    Alzheimer's disease Mother    Other Mother        has place in lung   Atrial fibrillation Mother    Cancer Sister  kidney; kidney was removed   Hypertension Son    Arthritis Daughter    Colon cancer Neg Hx      Social History Ms. Burgoon reports that she has never smoked. She has never used smokeless tobacco. Ms. Mooradian reports current alcohol use.   Review of Systems CONSTITUTIONAL: No weight loss, fever, chills, weakness or fatigue.  HEENT: Eyes: No visual loss, blurred vision, double vision or yellow sclerae.No hearing loss, sneezing, congestion, runny nose or sore throat.  SKIN: No rash or itching.  CARDIOVASCULAR: per hpi RESPIRATORY: No shortness of breath, cough or sputum.  GASTROINTESTINAL: No anorexia, nausea, vomiting or diarrhea. No abdominal pain or blood.  GENITOURINARY: No burning on urination, no  polyuria NEUROLOGICAL: No headache, dizziness, syncope, paralysis, ataxia, numbness or tingling in the extremities. No change in bowel or bladder control.  MUSCULOSKELETAL: No muscle, back pain, joint pain or stiffness.  LYMPHATICS: No enlarged nodes. No history of splenectomy.  PSYCHIATRIC: No history of depression or anxiety.  ENDOCRINOLOGIC: No reports of sweating, cold or heat intolerance. No polyuria or polydipsia.  Marland Kitchen   Physical Examination Today's Vitals   11/11/21 1313  BP: 130/80  Pulse: 78  SpO2: 96%  Weight: 205 lb 9.6 oz (93.3 kg)  Height: '5\' 9"'$  (1.753 m)   Body mass index is 30.36 kg/m.  Gen: resting comfortably, no acute distress HEENT: no scleral icterus, pupils equal round and reactive, no palptable cervical adenopathy,  CV: RRR, no m/r/g no jvd Resp: Clear to auscultation bilaterally GI: abdomen is soft, non-tender, non-distended, normal bowel sounds, no hepatosplenomegaly MSK: extremities are warm, no edema.  Skin: warm, no rash Neuro:  no focal deficits Psych: appropriate affect     Assessment and Plan  1.Palpitations - symptoms have resolved, unclear if related to respiratory infections/course of prednisone around that time - if recurrent symptoms would plan for outpatient monitor - baseline EKG shows NSR  2.Hyperlipidemia - request labs from pcp - reports side effects to multiple statins, on zetia though mixed compliance - request labs and notes from pcp, encouraged daily zetia    F/u as needed    Arnoldo Lenis, M.D.

## 2021-11-14 ENCOUNTER — Encounter: Payer: Self-pay | Admitting: *Deleted

## 2022-01-28 ENCOUNTER — Ambulatory Visit
Admission: EM | Admit: 2022-01-28 | Discharge: 2022-01-28 | Disposition: A | Discharge status: Home / Self Care | Payer: Medicare PPO | Attending: Nurse Practitioner | Admitting: Nurse Practitioner

## 2022-01-28 DIAGNOSIS — J069 Acute upper respiratory infection, unspecified: Secondary | ICD-10-CM

## 2022-01-28 MED ORDER — AMOXICILLIN-POT CLAVULANATE 875-125 MG PO TABS: 1.0000 | ORAL_TABLET | Freq: Two times a day (BID) | ORAL | 0 refills | Status: DC

## 2022-01-28 NOTE — ED Triage Notes (Signed)
Pt reports Thursday with a runny nose cough,sore throat, sinus congestion. Took allergy pills, alkezleter cold and flu

## 2022-01-28 NOTE — ED Provider Notes (Signed)
[facility]    CSN: [id number] Arrival date & time: 01/28/22  0827      History   Chief Complaint No chief complaint on file.   HPI Ruth Mendoza is a 71 y.o. female.   The history is provided by the patient.   Patient presents for complaints of runny nose, cough, sore throat, and nasal congestion that been present for the past 4 to 5 days.  Patient denies fever, chills, headache, wheezing, shortness of breath, or difficulty breathing.  Patient has been taking allergy medication, Alka-Seltzer cold and flu, and dextromethorphan.  She states that the last time she has symptoms similar to that she got pneumonia.  Patient states that she took a home COVID test last evening which was negative.  Past Medical History:  Diagnosis Date   High cholesterol    PONV (postoperative nausea and vomiting)     Patient Active Problem List   Diagnosis Date Noted   Epidermal cyst 08/02/2021   Special screening for malignant neoplasms, colon 07/30/2017    Past Surgical History:  Procedure Laterality Date   ABDOMINAL HYSTERECTOMY     BREAST BIOPSY     BREAST CYST EXCISION Left 30 yrs ago   BREAST SURGERY     CHOLECYSTECTOMY     COLONOSCOPY N/A 11/18/2017   Procedure: COLONOSCOPY;  Surgeon: Rogene Houston, MD;  [facility];  Service: Endoscopy;  Laterality: N/A;  930   POLYPECTOMY  11/18/2017   Procedure: POLYPECTOMY;  Surgeon: Rogene Houston, MD;  [facility];  Service: Endoscopy;;  splenic flexure polyp cs, hepatic flexure polyp cs ascending colon polyp hs, distal sigmoid colon polyp cs, rectal polyp cs   TONSILLECTOMY      OB History     Gravida  3   Para  3   Term  3   Preterm      AB      Living  3      SAB      IAB      Ectopic      Multiple      Live Births  3            Home Medications    Prior to Admission medications   Medication Sig Start Date End Date Taking? Authorizing Provider  amoxicillin-clavulanate  (AUGMENTIN) 875-125 MG tablet Take 1 tablet by mouth every 12 (twelve) hours. 02/02/22  Yes Special Ranes-Warren, Alda Lea, NP  albuterol (VENTOLIN HFA) 108 (90 Base) MCG/ACT inhaler Inhale 1-2 puffs into the lungs every 6 (six) hours as needed for wheezing or shortness of breath. Patient not taking: Reported on 11/11/2021 10/13/21   Volney American, PA-C  aspirin EC 81 MG tablet Take 81 mg by mouth daily. Swallow whole.    [provider]  azithromycin (ZITHROMAX) 250 MG tablet Take 1 tablet (250 mg total) by mouth daily. Take first 2 tablets together, then 1 every day until finished. 10/18/21   Carmin Alvidrez-Warren, Alda Lea, NP  cetirizine (ZYRTEC ALLERGY) 10 MG tablet Take 1 tablet (10 mg total) by mouth daily. 10/18/21   Lovely Kerins-Warren, Alda Lea, NP  ezetimibe (ZETIA) 10 MG tablet Take 10 mg by mouth daily. 03/14/21   [provider]  Multiple Vitamins-Minerals (CENTRUM WOMEN PO) Take by mouth.    [provider]  predniSONE (DELTASONE) 20 MG tablet Take 2 tablets (40 mg total) by mouth daily with breakfast. 10/13/21   Volney American, PA-C  promethazine-dextromethorphan (PROMETHAZINE-DM) 6.25-15  MG/5ML syrup Take 5 mLs by mouth 4 (four) times daily as needed for cough. 10/18/21   Seraphina Mitchner-Warren, Alda Lea, NP    Family History Family History  Problem Relation Age of Onset   Breast cancer Paternal Grandmother    Cancer Maternal Grandmother        stomach   Heart attack Maternal Grandfather    COPD Father    Alzheimer's disease Mother    Other Mother        has place in lung   Atrial fibrillation Mother    Cancer Sister        kidney; kidney was removed   Hypertension Son    Arthritis Daughter    Colon cancer Neg Hx     Social History Social History   Tobacco Use   Smoking status: Never   Smokeless tobacco: Never  Vaping Use   Vaping Use: Never used  Substance Use Topics   Alcohol use: Yes    Comment: occassional   Drug use: Never     Allergies    Codeine   Review of Systems Review of Systems Per HPI  Physical Exam Triage Vital Signs ED Triage Vitals  Enc Vitals Group     BP 01/28/22 0923 135/86     Pulse Rate 01/28/22 0923 77     Resp 01/28/22 0923 20     Temp 01/28/22 0923 98.1 F (36.7 C)     Temp Source 01/28/22 0923 Oral     SpO2 01/28/22 0923 95 %     Weight --      Height --      Head Circumference --      Peak Flow --      Pain Score 01/28/22 0929 0     Pain Loc --      Pain Edu? --      Excl. in [place]? --    No data found.  Updated Vital Signs BP 135/86 (BP Location: Right Arm)   Pulse 77   Temp 98.1 F (36.7 C) (Oral)   Resp 20   SpO2 95%   Visual Acuity Right Eye Distance:   Left Eye Distance:   Bilateral Distance:    Right Eye Near:   Left Eye Near:    Bilateral Near:     Physical Exam Vitals and nursing note reviewed.  Constitutional:      General: She is not in acute distress.    Appearance: Normal appearance.  HENT:     Head: Normocephalic.     Right Ear: Tympanic membrane, ear canal and external ear normal.     Left Ear: Tympanic membrane, ear canal and external ear normal.     Nose: Congestion present.     Right Turbinates: Enlarged and swollen.     Left Turbinates: Enlarged and swollen.     Right Sinus: No maxillary sinus tenderness or frontal sinus tenderness.     Left Sinus: No maxillary sinus tenderness or frontal sinus tenderness.     Mouth/Throat:     Mouth: Mucous membranes are moist.     Pharynx: Posterior oropharyngeal erythema present.  Eyes:     Extraocular Movements: Extraocular movements intact.     Conjunctiva/sclera: Conjunctivae normal.     Pupils: Pupils are equal, round, and reactive to light.  Cardiovascular:     Rate and Rhythm: Normal rate and regular rhythm.     Pulses: Normal pulses.     Heart sounds: Normal heart sounds.  Pulmonary:  Effort: Pulmonary effort is normal. No respiratory distress.     Breath sounds: Normal breath sounds. No stridor.  No wheezing, rhonchi or rales.  Abdominal:     General: Bowel sounds are normal.     Palpations: Abdomen is soft.     Tenderness: There is no abdominal tenderness.  Musculoskeletal:     Cervical back: Normal range of motion.  Lymphadenopathy:     Cervical: No cervical adenopathy.  Skin:    General: Skin is warm and dry.  Neurological:     General: No focal deficit present.     Mental Status: She is alert and oriented to person, place, and time.  Psychiatric:        Mood and Affect: Mood normal.        Behavior: Behavior normal.      UC Treatments / Results  Labs (all labs ordered are listed, but only abnormal results are displayed) Labs Reviewed - No data to display  EKG   Radiology No results found.  Procedures Procedures (including critical care time)  Medications Ordered in UC Medications - No data to display  Initial Impression / Assessment and Plan / UC Course  I have reviewed the triage vital signs and the nursing notes.  Pertinent labs & imaging results that were available during my care of the patient were reviewed by me and considered in my medical decision making (see chart for details).  Suspect symptoms are of viral etiology at this time.  Patient would like to continue the current medication she is taking at home.  Given her history of pneumonia, will approach symptoms with a watch and wait strategy.  Patient was advised that if symptoms do not improve by 02/02/2022, Augmentin 875/125 mg have been sent to the pharmacy for her to pick up on that date and time.  Supportive care recommendations were provided to the patient to include the use of warm salt water rinses, ibuprofen or Tylenol as needed for pain, and use of a humidifier.  Patient verbalizes understanding.  All questions were answered.  Patient is stable for discharge. Final Clinical Impressions(s) / UC Diagnoses   Final diagnoses:  Viral upper respiratory tract infection with cough     Discharge  Instructions      I am starting you on fluticasone nasal spray to help with the nasal congestion and postnasal drainage that you are experiencing.  Administer 2 sprays in each nostril daily while symptoms persist. Continue the medications you are currently taking at this time. Increase fluids and allow for plenty of rest. Recommend Tylenol or ibuprofen as needed for pain, fever, or general discomfort. Warm salt water gargles 3-4 times daily to help with throat pain or discomfort. As discussed, if your symptoms do not improve by 02/02/22, pick up the prescription for the antibiotic at your preferred pharmacy. Recommend using a humidifier at bedtime during sleep to help with cough and nasal congestion. Sleep elevated on 2 pillows. Follow-up if your symptoms do not improve.      ED Prescriptions     Medication Sig Dispense Auth. Provider   amoxicillin-clavulanate (AUGMENTIN) 875-125 MG tablet  (Status: Discontinued) Take 1 tablet by mouth every 12 (twelve) hours. 14 tablet Bailley Guilford-Warren, Alda Lea, NP   amoxicillin-clavulanate (AUGMENTIN) 875-125 MG tablet Take 1 tablet by mouth every 12 (twelve) hours. 14 tablet Anjelita Sheahan-Warren, Alda Lea, NP      PDMP not reviewed this encounter.   Tish Men, NP 01/28/22 1044

## 2022-01-28 NOTE — Discharge Instructions (Addendum)
I am starting you on fluticasone nasal spray to help with the nasal congestion and postnasal drainage that you are experiencing.  Administer 2 sprays in each nostril daily while symptoms persist. Continue the medications you are currently taking at this time. Increase fluids and allow for plenty of rest. Recommend Tylenol or ibuprofen as needed for pain, fever, or general discomfort. Warm salt water gargles 3-4 times daily to help with throat pain or discomfort. As discussed, if your symptoms do not improve by 02/02/22, pick up the prescription for the antibiotic at your preferred pharmacy. Recommend using a humidifier at bedtime during sleep to help with cough and nasal congestion. Sleep elevated on 2 pillows. Follow-up if your symptoms do not improve.

## 2022-02-03 ENCOUNTER — Ambulatory Visit: Payer: Medicare PPO | Attending: Cardiology | Admitting: *Deleted

## 2022-02-03 ENCOUNTER — Ambulatory Visit (INDEPENDENT_AMBULATORY_CARE_PROVIDER_SITE_OTHER): Payer: Medicare PPO

## 2022-02-03 ENCOUNTER — Other Ambulatory Visit: Payer: Self-pay | Admitting: Cardiology

## 2022-02-03 ENCOUNTER — Telehealth: Payer: Self-pay | Admitting: Cardiology

## 2022-02-03 DIAGNOSIS — R002 Palpitations: Secondary | ICD-10-CM | POA: Diagnosis not present

## 2022-02-03 NOTE — Telephone Encounter (Signed)
Patient called the [facility] stating that last evening her HR was 38 with no other symptoms. She states that Dr. Harl Bowie told her to contact the office if her HR went low or high.  This morning she states that it is 96.   She was taking allergy medication but stopped that last night. Her mother recently passed away also.  Please call [phone].

## 2022-02-03 NOTE — Telephone Encounter (Signed)
PERCERT:  7 DAY ZIO XT/enrolled and placed 02/03/22/Branch

## 2022-02-03 NOTE — Telephone Encounter (Signed)
Per Dr. Nilda Calamity she come in for a 12 lead and then a 7 day zio patch please for palpitations   Patient informed and verbalized understanding of plan.

## 2022-02-03 NOTE — Telephone Encounter (Signed)
Reports HR has been low on Fitbit. Says she feels her HR is beating heavy and slow. Last night HR was in 40's. Reports BP is higher than normal around 130/90. BP last checked last night 132/94. Denies fatigue, dizziness, lightheadedness, SOB or chest pain.  Medications reviewed.  Says she had this problem before and saw Branch. Says she was told to contact office if it happens again to see if she need a monitor. Says when she came in August, her symptoms had improved.  Advised message would be sent to provider. Advised if she develops symptoms, to go to the ED for an evaluation. Verbalized understanding of plan.

## 2022-02-03 NOTE — Progress Notes (Signed)
Presents for EKG per phone note regarding low HR via Fitbit.  EKG done and sent to provider for review. Fitbit HR showed 81 during time of EKG 7 Day ZIO XT placed and enroll

## 2022-02-04 NOTE — Progress Notes (Signed)
Per Dr. Nelia Shi. see what monitor shows

## 2022-02-19 DIAGNOSIS — R002 Palpitations: Secondary | ICD-10-CM | POA: Diagnosis not present

## 2022-04-07 ENCOUNTER — Telehealth: Payer: Self-pay | Admitting: Cardiology

## 2022-04-07 NOTE — Telephone Encounter (Signed)
Patient is requesting a call back to discuss monitor results. 

## 2022-04-09 ENCOUNTER — Encounter: Payer: Self-pay | Admitting: *Deleted

## 2022-04-09 ENCOUNTER — Telehealth: Payer: Self-pay | Admitting: *Deleted

## 2022-04-09 DIAGNOSIS — R002 Palpitations: Secondary | ICD-10-CM | POA: Diagnosis not present

## 2022-04-09 NOTE — Telephone Encounter (Signed)
Notified via mychart.

## 2022-04-09 NOTE — Telephone Encounter (Signed)
The patient verbally consented for a telehealth phone visit with [facility] and understands that his/her insurance company will be billed for the encounter.  

## 2022-04-10 ENCOUNTER — Ambulatory Visit: Payer: Medicare PPO | Attending: Cardiology | Admitting: Cardiology

## 2022-04-10 ENCOUNTER — Encounter: Payer: Self-pay | Admitting: Cardiology

## 2022-04-10 VITALS — Ht 70.0 in | Wt 202.0 lb

## 2022-04-10 DIAGNOSIS — I4892 Unspecified atrial flutter: Secondary | ICD-10-CM

## 2022-04-10 DIAGNOSIS — R002 Palpitations: Secondary | ICD-10-CM

## 2022-04-10 DIAGNOSIS — I471 Supraventricular tachycardia, unspecified: Secondary | ICD-10-CM

## 2022-04-10 MED ORDER — APIXABAN 5 MG PO TABS: 5.0000 mg | ORAL_TABLET | Freq: Two times a day (BID) | ORAL | 6 refills | Status: DC

## 2022-04-10 MED ORDER — METOPROLOL TARTRATE 25 MG PO TABS: 12.5000 mg | ORAL_TABLET | Freq: Two times a day (BID) | ORAL | 6 refills | Status: DC

## 2022-04-10 NOTE — Patient Instructions (Signed)
Medication Instructions:  Begin Lopressor 12.'5mg'$  twice a day   Begin Eliquis '5mg'$  twice a day   Stop Aspirin Continue all other medications.     Labwork: none  Testing/Procedures: Your physician has requested that you have an echocardiogram. Echocardiography is a painless test that uses sound waves to create images of your heart. It provides your doctor with information about the size and shape of your heart and how well your heart's chambers and valves are working. This procedure takes approximately one hour. There are no restrictions for this procedure. Please do NOT wear cologne, perfume, aftershave, or lotions (deodorant is allowed). Please arrive 15 minutes prior to your appointment time.  Office will contact with results via phone, letter or mychart.     Follow-Up: 3 months   Any Other Special Instructions Will Be Listed Below (If Applicable).   If you need a refill on your cardiac medications before your next appointment, please call your pharmacy.

## 2022-04-10 NOTE — Progress Notes (Signed)
Virtual Visit via Telephone Note   Because of Ruth Mendoza's co-morbid illnesses, she is at least at moderate risk for complications without adequate follow up.  This format is felt to be most appropriate for this patient at this time.  The patient did not have access to video technology/had technical difficulties with video requiring transitioning to audio format only (telephone).  All issues noted in this document were discussed and addressed.  No physical exam could be performed with this format.  Please refer to the patient's chart for her consent to telehealth for [facility].    Date:  04/10/2022   ID:  Ruth Mendoza, DOB 1950/04/10, MRN [id number] The patient was identified using 2 identifiers.  Patient Location: Home Provider Location: Office/Clinic   PCP:  Sharilyn Sites, [place] Providers Cardiologist:  Carlyle Dolly, MD     Evaluation Performed:  Follow-Up Visit  Chief Complaint:  Palpitations  History of Present Illness:    Ruth Mendoza is a 72 y.o. female seen today for a focused follow up visit for recent abnormal heart monitor findings and palpitations.    1.Palpitations/Paroxysmal aflutter/PSVT - pneumonia/bronchitis in March, prolonged symptoms. Extended medical management including 2 courses of prednisone - palpitations started in April - feeling of heart pounding. Fitbit would tell her HR was 40-150s. - ongoing symptoms May/June. No symptoms July or August - 2cups of coffee. N sodas, no tea. 2 mixed drinks a week      01/2022 monitor - 8 day monitor   Occasional supraventricular ectopy in the form of isolated PACs, couplets, triplets. 9 runs of SVT longest 6 beats   Rare ventricular ectopy in the form of isolated PVCs, couplets   <1% burden of atrial flutter with average rate of 157 bpm.   Symptoms correlated with sinus rhythm, PVCs, PACs, shorts runs SVT       Other medical problems not addressed this  visit   2. History of chest pain - previously seen at [place] Patient exercise 6 min 13 sec of standard protocol, approximately  9METS. Normal blood pressure response, no symptoms. She achieved  100% of predicted heart rate. At peak heart rate 38m of up  sloping ST depression. IMP. Negative exercise treadmill test   3. Hyperlipidemia - tolerates zetia - reports side effects on statins. Reports muscle aches on multiple statins.    Past Medical History:  Diagnosis Date   High cholesterol    PONV (postoperative nausea and vomiting)    Past Surgical History:  Procedure Laterality Date   ABDOMINAL HYSTERECTOMY     BREAST BIOPSY     BREAST CYST EXCISION Left 30 yrs ago   BREAST SURGERY     CHOLECYSTECTOMY     COLONOSCOPY N/A 11/18/2017   Procedure: COLONOSCOPY;  Surgeon: RRogene Houston MD;  [facility];  Service: Endoscopy;  Laterality: N/A;  930   POLYPECTOMY  11/18/2017   Procedure: POLYPECTOMY;  Surgeon: RRogene Houston MD;  [facility];  Service: Endoscopy;;  splenic flexure polyp cs, hepatic flexure polyp cs ascending colon polyp hs, distal sigmoid colon polyp cs, rectal polyp cs   TONSILLECTOMY       Current Meds  Medication Sig   aspirin EC 81 MG tablet Take 81 mg by mouth daily. Swallow whole.   Multiple Vitamins-Minerals (CENTRUM WOMEN PO) Take by mouth.     Allergies:   Codeine   Social History   Tobacco  Use   Smoking status: Never   Smokeless tobacco: Never  Vaping Use   Vaping Use: Never used  Substance Use Topics   Alcohol use: Yes    Comment: occassional   Drug use: Never     Family Hx: The patient's family history includes Alzheimer's disease in her mother; Arthritis in her daughter; Atrial fibrillation in her mother; Breast cancer in her paternal grandmother; COPD in her father; Cancer in her maternal grandmother and sister; Heart attack in her maternal grandfather; Hypertension in her son; Other in her mother. There is  no history of Colon cancer.  ROS:   Please see the history of present illness.     All other systems reviewed and are negative.   Prior CV studies:   The following studies were reviewed today:    Labs/Other Tests and Data Reviewed:    EKG:  No ECG reviewed.  Recent Labs: 06/12/2021: BUN 30; Creatinine, Ser 1.28; Hemoglobin 16.5; Platelets 291; Potassium 4.3; Sodium 136   Recent Lipid Panel No results found for: "CHOL", "TRIG", "HDL", "CHOLHDL", "LDLCALC", "LDLDIRECT"  Wt Readings from Last 3 Encounters:  04/10/22 202 lb (91.6 kg)  11/11/21 205 lb 9.6 oz (93.3 kg)  10/13/21 200 lb (90.7 kg)     Risk Assessment/Calculations:          Objective:    Vital Signs:  Ht '5\' 10"'$  (1.778 m)   Wt 202 lb (91.6 kg)   BMI 28.98 kg/m    Today's Vitals   04/10/22 1325  Weight: 202 lb (91.6 kg)  Height: '5\' 10"'$  (1.778 m)   Body mass index is 28.98 kg/m.  Normal affect. Normal speech pattern and tone. Comfortable, no apparent distress. No audible signs of sob or wheezing.   ASSESSMENT & PLAN:    1.Palpitations/PSVT/paroxysmal aflutter - recent monitor findings as reported above, parox aflutter and PSVT, PACs/PVCs - start lopressor 12.'5mg'$  bid - CHADS2Vasc score is 3, stop ASA and start eliquis '5mg'$  bid - obtain echo        Time:   Today, I have spent 18 minutes with the patient with telehealth technology discussing the above problems.     Medication Adjustments/Labs and Tests Ordered: Current medicines are reviewed at length with the patient today.  Concerns regarding medicines are outlined above.   Tests Ordered: No orders of the defined types were placed in this encounter.   Medication Changes: No orders of the defined types were placed in this encounter.   Follow Up:  f/u 3 months MD or PA  Signed, Carlyle Dolly, MD  04/10/2022 2:07 PM    [place]

## 2022-04-10 NOTE — Addendum Note (Signed)
Addended by: Laurine Blazer on: 04/10/2022 05:25 PM   Modules accepted: Orders

## 2022-04-11 ENCOUNTER — Telehealth: Payer: Self-pay | Admitting: Cardiology

## 2022-04-11 NOTE — Telephone Encounter (Signed)
Patient stated she took Lopressor this morning- pt checked bp this afternoon and stated bp was elevated at 147/92. Pt rechecked bp at 4:24 pm and stated it had come down some to 144/91. Pt stated bp usually runs 130/80 and this is a bit concerning to her. Patient stated that she will take medication as prescribed until she hears back from our office- but if bp is too elevated in her opinion, will stop medication through out the weekend until Monday   Please advise.

## 2022-04-11 NOTE — Telephone Encounter (Signed)
Patient called stating that her BP was 147/92 this afternoon. She started on new medication today. She is wanting to make certain that this is normal.

## 2022-04-14 ENCOUNTER — Telehealth: Payer: Self-pay | Admitting: Cardiology

## 2022-04-14 NOTE — Telephone Encounter (Signed)
Pt c/o medication issue:  1. Name of Medication:   metoprolol tartrate (LOPRESSOR) 25 MG tablet    2. How are you currently taking this medication (dosage and times per day)?   Take 0.5 tablets (12.5 mg total) by mouth 2 (two) times daily.    3. Are you having a reaction (difficulty breathing--STAT)? No  4. What is your medication issue? Pt states that over the weekend her BP was up and down. Pt says that after taking medication once daily BP has been steady. Pt wanted to make provider aware. Please advise

## 2022-04-14 NOTE — Telephone Encounter (Signed)
Patient said she is only take 12.5 mg once a day vs BID  She states over the weekend her BP was 88/61   BP now 119/83, HR 72   I will FYI Dr.Branch

## 2022-04-14 NOTE — Telephone Encounter (Signed)
Just sent Ruth Mendoza a message on this patient as she had reported high bp's last week, looks like reporting low bp's today. That's a very low dose of lopressor and typically would not drive the bp down that much, is there anything else going on such as a reason to be dehydrated. With high and low bp's can we screen if she is doing the right procedures for bp check, is she sure her cuff is accurate  Ruth Abts MD

## 2022-04-14 NOTE — Telephone Encounter (Signed)
Patient stated that she is not dehydrated- she is drinking plenty of water. 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 Patient stated that she believes the Lopressor 12.5 mg BID was too much for her and she feels much better on just the 0.5 tablet once daily and would like to continue. Patient stated since only taking 12.5 mg daily, bp has also leveled out and is in the normal range. Please advise.

## 2022-04-14 NOTE — Telephone Encounter (Signed)
Taking just one daily the medication is only in her system for about 12 hours. Can stick with this for now but may change in the future if things remains stable to a long acting dose of the same medication called toprol   Zandra Abts MD

## 2022-04-14 NOTE — Telephone Encounter (Signed)
Please see previous note.

## 2022-04-14 NOTE — Telephone Encounter (Signed)
Patient notified and verbalized understanding. 

## 2022-04-14 NOTE — Telephone Encounter (Signed)
Metoprolol is also used as a blood pressure medicaiton, it would not be able to cause her blood pressure to be elevated. If ongoing high bp's we may need to make an alternative adjustment. Any additional bp's?  Zandra Abts MD

## 2022-04-22 ENCOUNTER — Ambulatory Visit: Payer: Medicare PPO | Attending: Cardiology

## 2022-04-22 ENCOUNTER — Telehealth: Payer: Self-pay | Admitting: Cardiology

## 2022-04-22 DIAGNOSIS — I4892 Unspecified atrial flutter: Secondary | ICD-10-CM

## 2022-04-22 LAB — ECHOCARDIOGRAM COMPLETE
AR max vel: 1.97 cm2
AV Peak grad: 7.2 mmHg
Ao pk vel: 1.35 m/s
Area-P 1/2: 3.6 cm2
Calc EF: 67.6 %
MV M vel: 3.03 m/s
MV Peak grad: 36.6 mmHg
S' Lateral: 2.7 cm
Single Plane A2C EF: 66.7 %
Single Plane A4C EF: 68.8 %

## 2022-04-22 NOTE — Telephone Encounter (Signed)
Confirmed that she takes lopressor 12.5 mg daily after attempting 12.5 mg BID again. Says her BP dropped to 80/60 when taking 12.5 mg twice daily. Advised that MD response to this on 04/14/22 Taking just one daily the medication is only in her system for about 12 hours. Can stick with this for now but may change in the future if things remains stable to a long acting dose of the same medication called toprol     Ruth Abts MD  Advised same plan for now. Verbalized understanding.

## 2022-04-22 NOTE — Telephone Encounter (Signed)
Pt came in for her Echo and she's not been able to take the Metoprolol like Dr. Harl Bowie wants her to. States that when she takes 2 a day that her BP is dropping way to low.

## 2022-04-24 DIAGNOSIS — E7849 Other hyperlipidemia: Secondary | ICD-10-CM | POA: Diagnosis not present

## 2022-04-24 DIAGNOSIS — Z0001 Encounter for general adult medical examination with abnormal findings: Secondary | ICD-10-CM | POA: Diagnosis not present

## 2022-04-24 DIAGNOSIS — T466X5D Adverse effect of antihyperlipidemic and antiarteriosclerotic drugs, subsequent encounter: Secondary | ICD-10-CM | POA: Diagnosis not present

## 2022-04-24 DIAGNOSIS — E782 Mixed hyperlipidemia: Secondary | ICD-10-CM | POA: Diagnosis not present

## 2022-04-24 DIAGNOSIS — I4892 Unspecified atrial flutter: Secondary | ICD-10-CM | POA: Diagnosis not present

## 2022-04-24 DIAGNOSIS — Z6829 Body mass index (BMI) 29.0-29.9, adult: Secondary | ICD-10-CM | POA: Diagnosis not present

## 2022-04-24 DIAGNOSIS — E663 Overweight: Secondary | ICD-10-CM | POA: Diagnosis not present

## 2022-04-24 DIAGNOSIS — Z1331 Encounter for screening for depression: Secondary | ICD-10-CM | POA: Diagnosis not present

## 2022-04-24 DIAGNOSIS — Z23 Encounter for immunization: Secondary | ICD-10-CM | POA: Diagnosis not present

## 2022-04-28 ENCOUNTER — Encounter: Payer: Self-pay | Admitting: *Deleted

## 2022-04-28 ENCOUNTER — Telehealth: Payer: Self-pay | Admitting: Cardiology

## 2022-04-28 NOTE — Telephone Encounter (Signed)
STAT if HR is under 50 or over 120 (normal HR is 60-100 beats per minute)  What is your heart rate? 73  Do you have a log of your heart rate readings (document readings)?  She had three days in a row that it was in the 40's when she was resting at night.      Do you have any other symptoms? She said was dizzy, states he heat beats very hard and slow.

## 2022-04-28 NOTE — Telephone Encounter (Signed)
Replied via mychart.

## 2022-04-29 ENCOUNTER — Other Ambulatory Visit: Payer: Self-pay | Admitting: *Deleted

## 2022-04-29 MED ORDER — METOPROLOL SUCCINATE ER 25 MG PO TB24: 12.5000 mg | ORAL_TABLET | Freq: Every day | ORAL | 6 refills | Status: DC

## 2022-04-29 NOTE — Telephone Encounter (Signed)
Can we stop lopressor and try toprol 12.'5mg'$  daily This is a longer acting version of metoprolol that can be taken once a day.   Zandra Abts MD

## 2022-05-06 ENCOUNTER — Encounter: Payer: Self-pay | Admitting: *Deleted

## 2022-06-16 ENCOUNTER — Encounter: Payer: Self-pay | Admitting: Cardiology

## 2022-06-16 NOTE — Telephone Encounter (Signed)
Not used to interpreting these graphs but appears most of the low heart rates were overnight in early AM which is normal, waking hour rates look to be ok for the most part. Did she specifically notice rates in the 40s when she was awake? If only overnight 40s would be ok  Zandra Abts MD

## 2022-06-19 NOTE — Telephone Encounter (Signed)
What time of day does she take her toprol? If during the day would try taking at night and see how day heart rates do with that change  Zandra Abts MD

## 2022-07-03 ENCOUNTER — Ambulatory Visit: Payer: Medicare PPO | Admitting: Cardiology

## 2022-07-03 NOTE — Progress Notes (Unsigned)
Clinical Summary Ms. Bradfield is a 72 y.o.female    1.Palpitations/Paroxysmal aflutter/PSVT - pneumonia/bronchitis in March, prolonged symptoms. Extended medical management including 2 courses of prednisone - palpitations started in April - feeling of heart pounding. Fitbit would tell her HR was 40-150s. - ongoing symptoms May/June. No symptoms July or August - 2cups of coffee. N sodas, no tea. 2 mixed drinks a week      01/2022 monitor - 8 day monitor   Occasional supraventricular ectopy in the form of isolated PACs, couplets, triplets. 9 runs of SVT longest 6 beats   Rare ventricular ectopy in the form of isolated PVCs, couplets   <1% burden of atrial flutter with average rate of 157 bpm.   Symptoms correlated with sinus rhythm, PVCs, PACs, shorts runs SVT    - low bp's on lopressor 12.5mg  bid, changed to toprol 12.5mg  daily.         Other medical problems not addressed this visit   2. History of chest pain - previously seen at [place] Patient exercise 6 min 13 sec of standard protocol, approximately  9METS. Normal blood pressure response, no symptoms. She achieved  100% of predicted heart rate. At peak heart rate 80mm of up  sloping ST depression. IMP. Negative exercise treadmill test   3. Hyperlipidemia - tolerates zetia - reports side effects on statins. Reports muscle aches on multiple statins.     Past Medical History:  Diagnosis Date   High cholesterol    PONV (postoperative nausea and vomiting)      Allergies  Allergen Reactions   Codeine Nausea And Vomiting     Current Outpatient Medications  Medication Sig Dispense Refill   metoprolol succinate (TOPROL XL) 25 MG 24 hr tablet Take 0.5 tablets (12.5 mg total) by mouth daily. 15 tablet 6   apixaban (ELIQUIS) 5 MG TABS tablet Take 1 tablet (5 mg total) by mouth 2 (two) times daily. 60 tablet 6   aspirin EC 81 MG tablet Take 81 mg by mouth daily. Swallow whole.     Multiple Vitamins-Minerals  (CENTRUM WOMEN PO) Take by mouth.     No current facility-administered medications for this visit.     Past Surgical History:  Procedure Laterality Date   ABDOMINAL HYSTERECTOMY     BREAST BIOPSY     BREAST CYST EXCISION Left 30 yrs ago   BREAST SURGERY     CHOLECYSTECTOMY     COLONOSCOPY N/A 11/18/2017   Procedure: COLONOSCOPY;  Surgeon: Rogene Houston, MD;  [facility];  Service: Endoscopy;  Laterality: N/A;  930   POLYPECTOMY  11/18/2017   Procedure: POLYPECTOMY;  Surgeon: Rogene Houston, MD;  [facility];  Service: Endoscopy;;  splenic flexure polyp cs, hepatic flexure polyp cs ascending colon polyp hs, distal sigmoid colon polyp cs, rectal polyp cs   TONSILLECTOMY       Allergies  Allergen Reactions   Codeine Nausea And Vomiting      Family History  Problem Relation Age of Onset   Breast cancer Paternal Grandmother    Cancer Maternal Grandmother        stomach   Heart attack Maternal Grandfather    COPD Father    Alzheimer's disease Mother    Other Mother        has place in lung   Atrial fibrillation Mother    Cancer Sister        kidney; kidney was removed  Hypertension Son    Arthritis Daughter    Colon cancer Neg Hx      Social History Ms. Arista reports that she has never smoked. She has never used smokeless tobacco. Ms. Pinkos reports current alcohol use.   Review of Systems CONSTITUTIONAL: No weight loss, fever, chills, weakness or fatigue.  HEENT: Eyes: No visual loss, blurred vision, double vision or yellow sclerae.No hearing loss, sneezing, congestion, runny nose or sore throat.  SKIN: No rash or itching.  CARDIOVASCULAR:  RESPIRATORY: No shortness of breath, cough or sputum.  GASTROINTESTINAL: No anorexia, nausea, vomiting or diarrhea. No abdominal pain or blood.  GENITOURINARY: No burning on urination, no polyuria NEUROLOGICAL: No headache, dizziness, syncope, paralysis, ataxia, numbness or tingling in the  extremities. No change in bowel or bladder control.  MUSCULOSKELETAL: No muscle, back pain, joint pain or stiffness.  LYMPHATICS: No enlarged nodes. No history of splenectomy.  PSYCHIATRIC: No history of depression or anxiety.  ENDOCRINOLOGIC: No reports of sweating, cold or heat intolerance. No polyuria or polydipsia.  Marland Kitchen   Physical Examination There were no vitals filed for this visit. There were no vitals filed for this visit.  Gen: resting comfortably, no acute distress HEENT: no scleral icterus, pupils equal round and reactive, no palptable cervical adenopathy,  CV Resp: Clear to auscultation bilaterally GI: abdomen is soft, non-tender, non-distended, normal bowel sounds, no hepatosplenomegaly MSK: extremities are warm, no edema.  Skin: warm, no rash Neuro:  no focal deficits Psych: appropriate affect   Diagnostic Studies  Jan 2024 echo 1. Left ventricular ejection fraction, by estimation, is 60 to 65%. The  left ventricle has normal function. The left ventricle has no regional  wall motion abnormalities. There is mild left ventricular hypertrophy.  Left ventricular diastolic parameters  are consistent with Grade I diastolic dysfunction (impaired relaxation).  The average left ventricular global longitudinal strain is -19.5 %. The  global longitudinal strain is normal.   2. Right ventricular systolic function is normal. The right ventricular  size is normal.   3. The mitral valve is grossly normal. Trivial mitral valve  regurgitation. No evidence of mitral stenosis.   4. The aortic valve is tricuspid. Aortic valve regurgitation is not  visualized. No aortic stenosis is present.   5. The inferior vena cava is normal in size with greater than 50%  respiratory variability, suggesting right atrial pressure of 3 mmHg.   01/2022 heart monitor  8 day monitor   Occasional supraventricular ectopy in the form of isolated PACs, couplets, triplets. 9 runs of SVT longest 6 beats    Rare ventricular ectopy in the form of isolated PVCs, couplets   <1% burden of atrial flutter with average rate of 157 bpm.   Symptoms correlated with sinus rhythm, PVCs, PACs, shorts runs SVT     Assessment and Plan   1.Palpitations/PSVT/paroxysmal aflutter - recent monitor findings as reported above, parox aflutter and PSVT, PACs/PVCs - start lopressor 12.5mg  bid - CHADS2Vasc score is 3, stop ASA and start eliquis 5mg  bid - obtain echo     Arnoldo Lenis, M.D., F.A.C.C.

## 2022-07-05 ENCOUNTER — Emergency Department (HOSPITAL_COMMUNITY): Payer: Medicare PPO

## 2022-07-05 ENCOUNTER — Emergency Department (HOSPITAL_COMMUNITY)
Admission: EM | Admit: 2022-07-05 | Discharge: 2022-07-06 | Disposition: A | Discharge status: Home / Self Care | Payer: Medicare PPO | Attending: Emergency Medicine | Admitting: Emergency Medicine

## 2022-07-05 ENCOUNTER — Encounter (HOSPITAL_COMMUNITY): Payer: Self-pay | Admitting: Emergency Medicine

## 2022-07-05 ENCOUNTER — Other Ambulatory Visit: Payer: Self-pay

## 2022-07-05 DIAGNOSIS — R079 Chest pain, unspecified: Secondary | ICD-10-CM | POA: Diagnosis not present

## 2022-07-05 DIAGNOSIS — R072 Precordial pain: Secondary | ICD-10-CM | POA: Diagnosis not present

## 2022-07-05 DIAGNOSIS — R002 Palpitations: Secondary | ICD-10-CM | POA: Diagnosis not present

## 2022-07-05 DIAGNOSIS — Z7982 Long term (current) use of aspirin: Secondary | ICD-10-CM | POA: Diagnosis not present

## 2022-07-05 DIAGNOSIS — R110 Nausea: Secondary | ICD-10-CM | POA: Insufficient documentation

## 2022-07-05 DIAGNOSIS — Z7901 Long term (current) use of anticoagulants: Secondary | ICD-10-CM | POA: Insufficient documentation

## 2022-07-05 LAB — CBC
HCT: 45.7 % (ref 36.0–46.0)
Hemoglobin: 15.1 g/dL — ABNORMAL HIGH (ref 12.0–15.0)
MCH: 30.5 pg (ref 26.0–34.0)
MCHC: 33 g/dL (ref 30.0–36.0)
MCV: 92.3 fL (ref 80.0–100.0)
Platelets: 253 10*3/uL (ref 150–400)
RBC: 4.95 MIL/uL (ref 3.87–5.11)
RDW: 13 % (ref 11.5–15.5)
WBC: 7.6 10*3/uL (ref 4.0–10.5)
nRBC: 0 % (ref 0.0–0.2)

## 2022-07-05 LAB — BASIC METABOLIC PANEL
Anion gap: 9 (ref 5–15)
BUN: 23 mg/dL (ref 8–23)
CO2: 26 mmol/L (ref 22–32)
Calcium: 9.2 mg/dL (ref 8.9–10.3)
Chloride: 104 mmol/L (ref 98–111)
Creatinine, Ser: 1.15 mg/dL — ABNORMAL HIGH (ref 0.44–1.00)
GFR, Estimated: 51 mL/min — ABNORMAL LOW (ref >60)
Glucose, Bld: 65 mg/dL — ABNORMAL LOW (ref 70–99)
Potassium: 3.8 mmol/L (ref 3.5–5.1)
Sodium: 139 mmol/L (ref 135–145)

## 2022-07-05 LAB — TROPONIN I (HIGH SENSITIVITY): Troponin I (High Sensitivity): 3 ng/L (ref <18)

## 2022-07-05 NOTE — ED Triage Notes (Signed)
Chest pain started 2 hours ago. Pt states it just doesn't feel right.feels a flutter in heart and HR feels like it gets really fast then slows down to the 30s per her blood pressure cuff and watch. Denies any n/v/d. Denies SOB.

## 2022-07-05 NOTE — ED Provider Notes (Signed)
EMERGENCY DEPARTMENT AT [facility] Provider Note   CSN: [id number] Arrival date & time: 07/05/22  2217     History {Add pertinent medical, surgical, social history, OB history to HPI:1} Chief Complaint  Patient presents with   Chest Pain    Ruth Mendoza is a 72 y.o. female.  The history is provided by the patient and the spouse.  Patient history of hyperlipidemia, PSVT and paroxysmal atrial flutter presents with chest pain and palpitations. Patient reports that over the past 2 hours she has had chest tightness with nausea.  It also radiated into her neck.  At that time she was noted that her heart rate dropped down to the 30s.  It then quickly recovered.  No syncope.  No diaphoresis or shortness of breath.  Her chest pain is still ongoing but improved. She reports over the past several months she has had episodes with low and high heart rate.  However this appeared to be the worst episode of chest tightness she has had.  She has been seen by her cardiologist who is placed her on Toprol and Eliquis.  She is taking all of her medications as prescribed.  No accidental overmedication.  She had otherwise been at her baseline.  No fevers or cough   Past Medical History:  Diagnosis Date   High cholesterol    PONV (postoperative nausea and vomiting)     Home Medications Prior to Admission medications   Medication Sig Start Date End Date Taking? Authorizing Provider  metoprolol succinate (TOPROL XL) 25 MG 24 hr tablet Take 0.5 tablets (12.5 mg total) by mouth daily. 04/29/22   Antoine Poche, MD  apixaban (ELIQUIS) 5 MG TABS tablet Take 1 tablet (5 mg total) by mouth 2 (two) times daily. 04/10/22   Antoine Poche, MD  aspirin EC 81 MG tablet Take 81 mg by mouth daily. Swallow whole.    [provider]  Multiple Vitamins-Minerals (CENTRUM WOMEN PO) Take by mouth.    [provider]      Allergies    Codeine    Review of Systems   Review  of Systems  Constitutional:  Negative for diaphoresis and fever.  Cardiovascular:  Positive for chest pain and palpitations. Negative for leg swelling.  Gastrointestinal:  Positive for nausea. Negative for vomiting.  Neurological:  Negative for syncope.    Physical Exam Updated Vital Signs BP 112/76   Pulse 77   Temp 97.9 F (36.6 C) (Oral)   Resp 10   Wt 91.6 kg   SpO2 94%   BMI 28.98 kg/m  Physical Exam CONSTITUTIONAL: Well developed/well nourished HEAD: Normocephalic/atraumatic EYES: EOMI/PERRL ENMT: Mucous membranes moist NECK: supple no meningeal signs SPINE/BACK:entire spine nontender CV: Irregular, no loud murmurs LUNGS: Lungs are clear to auscultation bilaterally, no apparent distress ABDOMEN: soft, nontender, no rebound or guarding, bowel sounds noted throughout abdomen GU:no cva tenderness NEURO: Pt is awake/alert/appropriate, moves all extremitiesx4.  No facial droop.   EXTREMITIES: pulses normal/equal, full ROM, no lower extremity edema SKIN: warm, color normal PSYCH: no abnormalities of mood noted, alert and oriented to situation  ED Results / Procedures / Treatments   Labs (all labs ordered are listed, but only abnormal results are displayed) Labs Reviewed  BASIC METABOLIC PANEL - Abnormal; Notable for the following components:      Result Value   Glucose, Bld 65 (*)    Creatinine, Ser 1.15 (*)    GFR, Estimated 51 (*)  All other components within normal limits  CBC - Abnormal; Notable for the following components:   Hemoglobin 15.1 (*)    All other components within normal limits  TROPONIN I (HIGH SENSITIVITY)    EKG EKG Interpretation  Date/Time:  Saturday July 05 2022 23:08:46 EDT Ventricular Rate:  111 PR Interval:  170 QRS Duration: 98 QT Interval:  391 QTC Calculation: 492 R Axis:   29 Text Interpretation: Sinus rhythm Premature ventricular complexes Borderline prolonged QT interval Abnormal ekg Confirmed by Zadie RhineWickline, Kafi Dotter ([id number])  on 07/05/2022 11:36:13 PM  Radiology DG Chest 2 View  Result Date: 07/05/2022 CLINICAL DATA:  Chest pain. EXAM: CHEST - 2 VIEW COMPARISON:  June 12, 2021 FINDINGS: The heart size and mediastinal contours are within normal limits. Low lung volumes are seen. There is no evidence of an acute infiltrate, pleural effusion or pneumothorax. Radiopaque surgical clips are seen within the right upper quadrant. The visualized skeletal structures are unremarkable. IMPRESSION: Low lung volumes without evidence of acute or active cardiopulmonary disease. Electronically Signed   By: Aram Candelahaddeus  Houston M.D.   On: 07/05/2022 22:55    Procedures Procedures  {Document cardiac monitor, telemetry assessment procedure when appropriate:1}  Medications Ordered in ED Medications - No data to display  ED Course/ Medical Decision Making/ A&P   {   Click here for ABCD2, HEART and other calculatorsREFRESH Note before signing :1}                          Medical Decision Making Amount and/or Complexity of Data Reviewed Labs: ordered. Radiology: ordered. ECG/medicine tests: ordered.   This patient presents to the ED for concern of palpitations and chest pain, this involves an extensive number of treatment options, and is a complaint that carries with it a high risk of complications and morbidity.  The differential diagnosis includes but is not limited to acute coronary syndrome, aortic dissection, pulmonary embolism, pericarditis, pneumothorax, pneumonia, myocarditis, pleurisy, esophageal rupture    Comorbidities that complicate the patient evaluation: Patient's presentation is complicated by their history of hyperlipidemia, atrial flutter   Additional history obtained: Additional history obtained from spouse Records reviewed  cardiology notes  Lab Tests: I Ordered, and personally interpreted labs.  The pertinent results include: Mild hypoglycemia  Imaging Studies ordered: I ordered imaging studies including  X-ray chest   I independently visualized and interpreted imaging which showed no acute finding I agree with the radiologist interpretation  Cardiac Monitoring: The patient was maintained on a cardiac monitor.  I personally viewed and interpreted the cardiac monitor which showed an underlying rhythm of:  {cardiac monitor:26849}  Medicines ordered and prescription drug management: I ordered medication including ***  for ***  Reevaluation of the patient after these medicines showed that the patient    {resolved/improved/worsened:23923::"improved"}  Test Considered: Patient is low risk / negative by ***, therefore do not feel that *** is indicated.  Critical Interventions:  ***  Consultations Obtained: I requested consultation with the {consultation:26851}, and discussed  findings as well as pertinent plan - they recommend: ***  Reevaluation: After the interventions noted above, I reevaluated the patient and found that they have :{resolved/improved/worsened:23923::"improved"}  Complexity of problems addressed: Patient's presentation is most consistent with  acute presentation with potential threat to life or bodily function  Disposition: After consideration of the diagnostic results and the patient's response to treatment,  I feel that the patent would benefit from {disposition:26850}.     {Document critical care  time when appropriate:1} {Document review of labs and clinical decision tools ie heart score, Chads2Vasc2 etc:1}  {Document your independent review of radiology images, and any outside records:1} {Document your discussion with family members, caretakers, and with consultants:1} {Document social determinants of health affecting pt's care:1} {Document your decision making why or why not admission, treatments were needed:1} Final Clinical Impression(s) / ED Diagnoses Final diagnoses:  None    Rx / DC Orders ED Discharge Orders     None

## 2022-07-05 NOTE — ED Notes (Signed)
Patient transported to X-ray 

## 2022-07-05 NOTE — ED Notes (Signed)
Pt reports tightness to L chest that initially radiated into neck, associated nausea. Pt reports she has had similar episodes over last few months. Pt reports her watch records pulse rates in the 40's when tightness starts.

## 2022-07-06 LAB — TROPONIN I (HIGH SENSITIVITY): Troponin I (High Sensitivity): 3 ng/L (ref <18)

## 2022-07-06 NOTE — ED Notes (Signed)
Pt stated that she felt weak, pt's BP was 83/61. Rechecked BP at 0005, BP 111/60. EDP made aware.

## 2022-07-06 NOTE — ED Notes (Signed)
Pt given sprite per EDP request.

## 2022-07-08 ENCOUNTER — Telehealth: Payer: Self-pay | Admitting: Nurse Practitioner

## 2022-07-08 ENCOUNTER — Ambulatory Visit: Payer: Medicare PPO | Attending: Cardiology | Admitting: Nurse Practitioner

## 2022-07-08 ENCOUNTER — Encounter: Payer: Self-pay | Admitting: Nurse Practitioner

## 2022-07-08 VITALS — BP 122/79 | HR 70 | Ht 70.0 in | Wt 207.6 lb

## 2022-07-08 DIAGNOSIS — Z79899 Other long term (current) drug therapy: Secondary | ICD-10-CM

## 2022-07-08 DIAGNOSIS — E785 Hyperlipidemia, unspecified: Secondary | ICD-10-CM

## 2022-07-08 DIAGNOSIS — I4892 Unspecified atrial flutter: Secondary | ICD-10-CM

## 2022-07-08 DIAGNOSIS — R0789 Other chest pain: Secondary | ICD-10-CM | POA: Diagnosis not present

## 2022-07-08 DIAGNOSIS — R002 Palpitations: Secondary | ICD-10-CM | POA: Diagnosis not present

## 2022-07-08 DIAGNOSIS — R001 Bradycardia, unspecified: Secondary | ICD-10-CM

## 2022-07-08 DIAGNOSIS — I471 Supraventricular tachycardia, unspecified: Secondary | ICD-10-CM | POA: Diagnosis not present

## 2022-07-08 NOTE — Telephone Encounter (Signed)
30 day [employer] dx: Aflutter   percert

## 2022-07-08 NOTE — Progress Notes (Unsigned)
Office Visit    Patient Name: Ruth Mendoza Date of Encounter: 07/08/2022  PCP:  Assunta Found, MD   [facility]  Cardiologist:  Dina Rich, MD *** Advanced Practice Provider:  No care team member to display Electrophysiologist:  None  {Press F2 to show EP APP, CHF, sleep or structural heart MD               :[id number]}  { Click here to update then REFRESH NOTE - MD (PCP) or APP (Team Member)  Change PCP Type for MD, Specialty for APP is either Cardiology or Clinical Cardiac Electrophysiology  :[id number]}  Chief Complaint    Ruth Mendoza is a 72 y.o. female with a hx of chest pain, palpitations, paroxysmal a flutter/PSVT, hyperlipidemia, who presents today for regular follow-up.  Past Medical History    Past Medical History:  Diagnosis Date   High cholesterol    PONV (postoperative nausea and vomiting)    Past Surgical History:  Procedure Laterality Date   ABDOMINAL HYSTERECTOMY     BREAST BIOPSY     BREAST CYST EXCISION Left 30 yrs ago   BREAST SURGERY     CHOLECYSTECTOMY     COLONOSCOPY N/A 11/18/2017   Procedure: COLONOSCOPY;  Surgeon: Malissa Hippo, MD;  Location: [facility];  Service: Endoscopy;  Laterality: N/A;  930   POLYPECTOMY  11/18/2017   Procedure: POLYPECTOMY;  Surgeon: Malissa Hippo, MD;  Location: [facility];  Service: Endoscopy;;  splenic flexure polyp cs, hepatic flexure polyp cs ascending colon polyp hs, distal sigmoid colon polyp cs, rectal polyp cs   TONSILLECTOMY      Allergies  Allergies  Allergen Reactions   Codeine Nausea And Vomiting    History of Present Illness    Ruth Mendoza is a 72 y.o. female with a PMH as mentioned above.  Negative exercise treadmill test in 2015.  Previous monitor worn in November 2023 revealed occasional supraventricular ectopy, 9 runs of SVT with longest lasting 6 beats, rare ventricular ectopy, less than 1% burden of atrial flutter average heart rate 157,  symptoms correlated with PACs/PVCs, short runs of SVT.  Last seen by Dr. Dina Rich on April 10, 2022.  Was started on Lopressor 12.5 mg twice daily.  CHA2DS2-VASc score is 3, was told to stop aspirin and start Eliquis 5 mg twice daily.  Echo was unremarkable.   Today she presents for 48-month follow-up.  She states  EKGs/Labs/Other Studies Reviewed:   The following studies were reviewed today: ***  EKG:  EKG is *** ordered today.  The ekg ordered today demonstrates ***  Recent Labs: 07/05/2022: BUN 23; Creatinine, Ser 1.15; Hemoglobin 15.1; Platelets 253; Potassium 3.8; Sodium 139  Recent Lipid Panel No results found for: "CHOL", "TRIG", "HDL", "CHOLHDL", "VLDL", "LDLCALC", "LDLDIRECT"  Risk Assessment/Calculations:  {Does this patient have ATRIAL FIBRILLATION?[phone]}  Home Medications   No outpatient medications have been marked as taking for the 07/08/22 encounter (Appointment) with Sharlene Dory, NP.     Review of Systems   ***   All other systems reviewed and are otherwise negative except as noted above.  Physical Exam    VS:  There were no vitals taken for this visit. , BMI There is no height or weight on file to calculate BMI.  Wt Readings from Last 3 Encounters:  07/05/22 201 lb 15.1 oz (91.6 kg)  04/10/22 202 lb (91.6 kg)  11/11/21 205 lb 9.6 oz (93.3 kg)  GEN: Well nourished, well developed, in no acute distress. HEENT: normal. Neck: Supple, no JVD, carotid bruits, or masses. Cardiac: ***RRR, no murmurs, rubs, or gallops. No clubbing, cyanosis, edema.  ***Radials/PT 2+ and equal bilaterally.  Respiratory:  ***Respirations regular and unlabored, clear to auscultation bilaterally. GI: Soft, nontender, nondistended. MS: No deformity or atrophy. Skin: Warm and dry, no rash. Neuro:  Strength and sensation are intact. Psych: Normal affect.  Assessment & Plan    ***  {Are you ordering a CV Procedure (e.g. stress test, cath, DCCV, TEE, etc)?    Press F2        :[id number]}      Disposition: Follow up {follow up:15908} with Dina Rich, MD or APP.  Signed, Sharlene Dory, NP 07/08/2022, 12:51 PM [facility]

## 2022-07-08 NOTE — Patient Instructions (Addendum)
Medication Instructions:  Your physician has recommended you make the following change in your medication:  Take metoprolol in the morning Continue all other medications as directed  Labwork: Labs due in 2 weeks @ [facility] Thyroid Panel Fasting Lipid  Testing/Procedures: Your physician has recommended that you wear an event monitor. Event monitors are medical devices that record the heart's electrical activity. Doctors most often [place] these monitors to diagnose arrhythmias. Arrhythmias are problems with the speed or rhythm of the heartbeat. The monitor is a small, portable device. You can wear one while you do your normal daily activities. This is usually used to diagnose what is causing palpitations/syncope (passing out).   Follow-Up:  Your physician recommends that you schedule a follow-up appointment in: 6-8 weeks  Any Other Special Instructions Will Be Listed Below (If Applicable).  If you need a refill on your cardiac medications before your next appointment, please call your pharmacy.

## 2022-07-09 ENCOUNTER — Encounter: Payer: Self-pay | Admitting: Nurse Practitioner

## 2022-07-14 ENCOUNTER — Ambulatory Visit: Payer: Medicare PPO | Attending: Nurse Practitioner

## 2022-07-14 DIAGNOSIS — I4892 Unspecified atrial flutter: Secondary | ICD-10-CM

## 2022-07-15 DIAGNOSIS — M25562 Pain in left knee: Secondary | ICD-10-CM | POA: Diagnosis not present

## 2022-07-22 ENCOUNTER — Other Ambulatory Visit (HOSPITAL_COMMUNITY)
Admission: RE | Admit: 2022-07-22 | Discharge: 2022-07-22 | Disposition: A | Discharge status: Home / Self Care | Payer: Medicare PPO | Source: Ambulatory Visit | Attending: Nurse Practitioner | Admitting: Nurse Practitioner

## 2022-07-22 DIAGNOSIS — R001 Bradycardia, unspecified: Secondary | ICD-10-CM | POA: Insufficient documentation

## 2022-07-22 DIAGNOSIS — I4892 Unspecified atrial flutter: Secondary | ICD-10-CM | POA: Insufficient documentation

## 2022-07-22 DIAGNOSIS — Z79899 Other long term (current) drug therapy: Secondary | ICD-10-CM

## 2022-07-22 LAB — LIPID PANEL
Cholesterol: 276 mg/dL — ABNORMAL HIGH (ref 0–200)
HDL: 48 mg/dL (ref >40)
LDL Cholesterol: 181 mg/dL — ABNORMAL HIGH (ref 0–99)
Total CHOL/HDL Ratio: 5.8 RATIO
Triglycerides: 237 mg/dL — ABNORMAL HIGH (ref <150)
VLDL: 47 mg/dL — ABNORMAL HIGH (ref 0–40)

## 2022-07-22 LAB — MAGNESIUM: Magnesium: 2 mg/dL (ref 1.7–2.4)

## 2022-07-22 LAB — BASIC METABOLIC PANEL
Anion gap: 9 (ref 5–15)
BUN: 30 mg/dL — ABNORMAL HIGH (ref 8–23)
CO2: 22 mmol/L (ref 22–32)
Calcium: 8.9 mg/dL (ref 8.9–10.3)
Chloride: 106 mmol/L (ref 98–111)
Creatinine, Ser: 1.1 mg/dL — ABNORMAL HIGH (ref 0.44–1.00)
GFR, Estimated: 54 mL/min — ABNORMAL LOW (ref >60)
Glucose, Bld: 101 mg/dL — ABNORMAL HIGH (ref 70–99)
Potassium: 4 mmol/L (ref 3.5–5.1)
Sodium: 137 mmol/L (ref 135–145)

## 2022-07-23 LAB — THYROID PANEL
Free Thyroxine Index: 1.9 (ref 1.2–4.9)
T3 Uptake Ratio: 27 % (ref 24–39)
T4, Total: 6.9 ug/dL (ref 4.5–12.0)

## 2022-08-04 ENCOUNTER — Telehealth: Payer: Self-pay | Admitting: Cardiology

## 2022-08-04 DIAGNOSIS — Z79899 Other long term (current) drug therapy: Secondary | ICD-10-CM

## 2022-08-04 DIAGNOSIS — Z789 Other specified health status: Secondary | ICD-10-CM

## 2022-08-04 DIAGNOSIS — E785 Hyperlipidemia, unspecified: Secondary | ICD-10-CM

## 2022-08-04 NOTE — Telephone Encounter (Signed)
Patient is returning call about lab results  

## 2022-08-04 NOTE — Telephone Encounter (Signed)
-----   Message from Sharlene Dory, NP sent at 07/29/2022 11:19 PM EDT ----- Kidney function is improving. Mag and thyroid panel look good. Cholesterol panel shows levels are not at goal. Please refer to Lipid Clinic for discussion of PCSK9i/Leqvio.   Thanks!  Sharlene Dory, AGNP-C

## 2022-08-04 NOTE — Telephone Encounter (Signed)
Patient informed and verbalized understanding of plan. Copy sent to PCP 

## 2022-08-26 ENCOUNTER — Other Ambulatory Visit: Payer: Self-pay | Admitting: Family Medicine

## 2022-08-26 ENCOUNTER — Encounter: Payer: Self-pay | Admitting: Nurse Practitioner

## 2022-08-26 ENCOUNTER — Encounter: Payer: Self-pay | Admitting: *Deleted

## 2022-08-26 ENCOUNTER — Telehealth: Payer: Self-pay | Admitting: Nurse Practitioner

## 2022-08-26 ENCOUNTER — Ambulatory Visit: Payer: Medicare PPO | Attending: Nurse Practitioner | Admitting: Nurse Practitioner

## 2022-08-26 VITALS — BP 130/82 | HR 63 | Ht 69.0 in | Wt 210.4 lb

## 2022-08-26 DIAGNOSIS — R001 Bradycardia, unspecified: Secondary | ICD-10-CM

## 2022-08-26 DIAGNOSIS — E785 Hyperlipidemia, unspecified: Secondary | ICD-10-CM | POA: Diagnosis not present

## 2022-08-26 DIAGNOSIS — I4892 Unspecified atrial flutter: Secondary | ICD-10-CM | POA: Diagnosis not present

## 2022-08-26 DIAGNOSIS — R002 Palpitations: Secondary | ICD-10-CM | POA: Diagnosis not present

## 2022-08-26 DIAGNOSIS — I471 Supraventricular tachycardia, unspecified: Secondary | ICD-10-CM | POA: Diagnosis not present

## 2022-08-26 DIAGNOSIS — R0789 Other chest pain: Secondary | ICD-10-CM | POA: Diagnosis not present

## 2022-08-26 DIAGNOSIS — Z1231 Encounter for screening mammogram for malignant neoplasm of breast: Secondary | ICD-10-CM

## 2022-08-26 NOTE — Telephone Encounter (Signed)
Checking percert on the following patient for testing scheduled at [facility].     CUP EXERCISE TOLERANCE  09/01/2022

## 2022-08-26 NOTE — Patient Instructions (Signed)
Medication Instructions:   Continue all current medications.   Labwork:  none  Testing/Procedures:  Your physician has requested that you have an exercise tolerance test. For further information please visit [email or web address] Please also follow instruction sheet, as given.  Office will contact with results via phone, letter or mychart.     Follow-Up:  6 - 8 weeks    Any Other Special Instructions Will Be Listed Below (If Applicable).   If you need a refill on your cardiac medications before your next appointment, please call your pharmacy.

## 2022-08-26 NOTE — Progress Notes (Addendum)
Office Visit    Patient Name: Ruth Mendoza Date of Encounter: 08/26/2022  PCP:  Assunta Found, MD   [facility]  Cardiologist:  Dina Rich, MD  Advanced Practice Provider:  No care team member to display Electrophysiologist:  None   Chief Complaint    Ruth Mendoza is a very pleasant 72 y.o. female with a hx of chest pain, palpitations, paroxysmal a flutter/PSVT, hyperlipidemia (statin intolerance), who presents today for regular follow-up.  Past Medical History    Past Medical History:  Diagnosis Date   High cholesterol    PONV (postoperative nausea and vomiting)    Past Surgical History:  Procedure Laterality Date   ABDOMINAL HYSTERECTOMY     BREAST BIOPSY     BREAST CYST EXCISION Left 30 yrs ago   BREAST SURGERY     CHOLECYSTECTOMY     COLONOSCOPY N/A 11/18/2017   Procedure: COLONOSCOPY;  Surgeon: Malissa Hippo, MD;  [facility];  Service: Endoscopy;  Laterality: N/A;  930   POLYPECTOMY  11/18/2017   Procedure: POLYPECTOMY;  Surgeon: Malissa Hippo, MD;  Location: [facility];  Service: Endoscopy;;  splenic flexure polyp cs, hepatic flexure polyp cs ascending colon polyp hs, distal sigmoid colon polyp cs, rectal polyp cs   TONSILLECTOMY      Allergies  Allergies  Allergen Reactions   Codeine Nausea And Vomiting    History of Present Illness    Ruth Mendoza is a very pleasant 72 y.o. female with a PMH as mentioned above.  Negative exercise treadmill test in 2015.  Previous monitor worn in November 2023 revealed occasional supraventricular ectopy, 9 runs of SVT with longest lasting 6 beats, rare ventricular ectopy, less than 1% burden of atrial flutter average heart rate 157, symptoms correlated with PACs/PVCs, short runs of SVT.  Last seen by Dr. Dina Rich on April 10, 2022.  Was started on Lopressor 12.5 mg twice daily.  CHA2DS2-VASc score is 3, was told to stop aspirin and start Eliquis 5 mg  twice daily.  Echo was unremarkable.   Seen in [facility] ED 07/05/2022 for precordial pain. Denied syncope, diaphoresis/SHOB. HR was in 90's/low 100's in ED, irregular with PVC's. Troponins normal. PVC's noted on monitor, no signs of bradycardia. Had transient episode of hypotension that resolved. CXR negative.  Last saw her for 58-month follow-up on July 08, 2022.  Stated she monitored her heart rate on apple watch and was having episodes of pulse in 30's-40's and HR elevated at times also, noted readings with HR ranging from 40's/50's - 120's. Denied any chest pain, but felt chest tightness when her heart rate dropped. Denied any  shortness of breath, syncope, presyncope, dizziness, orthopnea, PND, swelling or significant weight changes, acute bleeding, or claudication. Admitted to statin intolerance, currently PCP stopped Crestor.  30-day event monitor was arranged and report revealed predominantly sinus rhythm, overall benign.   Today she presents for follow-up.  Continues to admit to labile heart rates, and we are from 40s to 60s.  Chest tightness continues to remain the same.  Denies any active chest pain today. Denies any shortness of breath, palpitations, syncope, presyncope, dizziness, orthopnea, PND, swelling or significant weight changes, acute bleeding, or claudication.  Shows me her blood pressure log with overall normal/stable readings, 2 reports of SBP in 90s at nighttime.  STOP-BANG score 2.  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 Son works as Midwife for  [employer]iff[employer].   EKGs/Labs/Other Studies Reviewed:   The following studies were reviewed today:   EKG:  EKG is not ordered today.    30-day event monitor    30 day monitor   Min HR 39, Max HR 160, avg HR 80   No symptoms reported   Episodes of afib vs aflutter noted, at times with elevated rates to 120s-130s  Echo 03/2022: 1. Left ventricular ejection fraction, by estimation, is 60 to 65%. The  left  ventricle has normal function. The left ventricle has no regional  wall motion abnormalities. There is mild left ventricular hypertrophy.  Left ventricular diastolic parameters  are consistent with Grade I diastolic dysfunction (impaired relaxation).  The average left ventricular global longitudinal strain is -19.5 %. The  global longitudinal strain is normal.   2. Right ventricular systolic function is normal. The right ventricular  size is normal.   3. The mitral valve is grossly normal. Trivial mitral valve  regurgitation. No evidence of mitral stenosis.   4. The aortic valve is tricuspid. Aortic valve regurgitation is not  visualized. No aortic stenosis is present.   5. The inferior vena cava is normal in size with greater than 50%  respiratory variability, suggesting right atrial pressure of 3 mmHg.   Comparison(s): No prior Echocardiogram.  Monitor 03/2022:   8 day monitor   Occasional supraventricular ectopy in the form of isolated PACs, couplets, triplets. 9 runs of SVT longest 6 beats   Rare ventricular ectopy in the form of isolated PVCs, couplets   <1% burden of atrial flutter with average rate of 157 bpm.   Symptoms correlated with sinus rhythm, PVCs, PACs, shorts runs SVT   Recent Labs: 07/05/2022: Hemoglobin 15.1; Platelets 253 07/22/2022: BUN 30; Creatinine, Ser 1.10; Magnesium 2.0; Potassium 4.0; Sodium 137  Recent Lipid Panel    Component Value Date/Time   CHOL 276 (H) 07/22/2022 0829   TRIG 237 (H) 07/22/2022 0829   HDL 48 07/22/2022 0829   CHOLHDL 5.8 07/22/2022 0829   VLDL 47 (H) 07/22/2022 0829   LDLCALC 181 (H) 07/22/2022 0829    Risk Assessment/Calculations:   CHA2DS2-VASc Score = 2  This indicates a 2.2% annual risk of stroke. The patient's score is based upon: CHF History: 0 HTN History: 0 Diabetes History: 0 Stroke History: 0 Vascular Disease History: 0 Age Score: 1 Gender Score: 1      Home Medications   Current Meds  Medication Sig    apixaban (ELIQUIS) 5 MG TABS tablet Take 1 tablet (5 mg total) by mouth 2 (two) times daily.   metoprolol succinate (TOPROL XL) 25 MG 24 hr tablet Take 0.5 tablets (12.5 mg total) by mouth daily.   Multiple Vitamins-Minerals (CENTRUM WOMEN PO) Take 1 tablet by mouth daily.     Review of Systems    All other systems reviewed and are otherwise negative except as noted above.  Physical Exam    VS:  BP 130/82   Pulse 63   Ht 5\' 9"  (1.753 m)   Wt 210 lb 6.4 oz (95.4 kg)   SpO2 97%   BMI 31.07 kg/m  , BMI Body mass index is 31.07 kg/m.  Wt Readings from Last 3 Encounters:  08/26/22 210 lb 6.4 oz (95.4 kg)  07/08/22 207 lb 9.6 oz (94.2 kg)  07/05/22 201 lb 15.1 oz (91.6 kg)     GEN: Well nourished, well developed, in no acute distress. HEENT: normal. Neck: Supple, no JVD, carotid bruits, or masses.  Cardiac: S1/S2, RRR, no murmurs, rubs, or gallops. No clubbing, cyanosis, edema.  Radials/PT 2+ and equal bilaterally.  Respiratory:  Respirations regular and unlabored, clear to auscultation bilaterally. MS: No deformity or atrophy. Skin: Warm and dry, no rash. Neuro:  Strength and sensation are intact. Psych: Normal affect.  Assessment & Plan    Paroxysmal A-flutter/PSVT, bradycardia, palpitations Admits to wide range in HR. Etiology unclear.  Addendum (09/17/22) 30-day event monitor revealed overall NSR, with asymptomatic fast episodes of A-fib/A-flutter, average HR normal.  Will have patient continue 12.5 mg of Toprol-XL daily every other day (see telephone note).  Continue Eliquis 5 mg BID, on appropriate dosage. ED precautions discussed.   Chest tightness Related to #1 episodes. Denies any exertional/anginal chest pain.  Etiology multifactorial.  Will arrange ETT to evaluate for any ischemia.  Continue to follow with PCP.  No other medication changes at this time.  ED precautions discussed.  Shared Decision Making/Informed Consent The risks [chest pain, shortness of breath,  cardiac arrhythmias, dizziness, blood pressure fluctuations, myocardial infarction, stroke/transient ischemic attack, and life-threatening complications (estimated to be 1 in 10,000)], benefits (risk stratification, diagnosing coronary artery disease, treatment guidance) and alternatives of an exercise tolerance test were discussed in detail with Ms. Honer and she agrees to proceed.   HLD States she is recently off rosuvastatin - could not tolerate. Managed by PCP.  Repeat labs showed LDL not at goal.  Has upcoming clinic visit with lipid clinic tomorrow.    Disposition: Follow up in 6-8 week(s) with Dina Rich, MD or APP.  Signed, Sharlene Dory, NP 08/26/2022, 11:46 AM [facility]

## 2022-08-27 ENCOUNTER — Ambulatory Visit: Payer: Medicare PPO | Attending: Cardiology | Admitting: Student

## 2022-08-27 ENCOUNTER — Other Ambulatory Visit (HOSPITAL_COMMUNITY): Payer: Self-pay

## 2022-08-27 ENCOUNTER — Telehealth: Payer: Self-pay

## 2022-08-27 ENCOUNTER — Encounter: Payer: Self-pay | Admitting: Student

## 2022-08-27 ENCOUNTER — Telehealth: Payer: Self-pay | Admitting: Pharmacist

## 2022-08-27 DIAGNOSIS — E7800 Pure hypercholesterolemia, unspecified: Secondary | ICD-10-CM | POA: Diagnosis not present

## 2022-08-27 DIAGNOSIS — E785 Hyperlipidemia, unspecified: Secondary | ICD-10-CM

## 2022-08-27 NOTE — Progress Notes (Signed)
Patient ID: Ruth Mendoza                 DOB: 04-17-1950                    MRN: [id number]      HPI: Ruth Mendoza is a 72 y.o. female patient referred to lipid clinic by Gus Rankin. PMH is significant for chest pain, palpitations, paroxysmal a flutter/PSVT, hyperlipidemia (statin intolerance)   Patient presented today with her husband. She has tried various statins and ezetimibe in the past. They were giving her muscle and joint pain and affecting her mobility. She could not remember the doses. She loves her sweets. She has improved her diet in 2019 but now she is back to eating lots of carb. She has not been exercising regularly. Reports premature ASCVD on his mother side family. Reviewed options for lowering LDL cholesterol, including  PCSK-9 inhibitors, bempedoic acid and inclisiran.  Discussed mechanisms of action, dosing, side effects and potential decreases in LDL cholesterol.  Also reviewed cost information and potential options for patient assistance.   Diet: loves carb Had tried keto diet in the past and was successful losing 30 lbs  Breakfast- eggs cheese, ham, sausage  Lunch: whole grain bread, salads  Dinner: meat, vegetables rice pasta  Exercise: none   Family History:  Mother: atrial fibrillation Father: MI in his 17's,HDL, died from COPD  Social History:  Alcohol: none  Tobacco smoking: never    Labs: Lipid Panel     Component Value Date/Time   CHOL 276 (H) 07/22/2022 0829   TRIG 237 (H) 07/22/2022 0829   HDL 48 07/22/2022 0829   CHOLHDL 5.8 07/22/2022 0829   VLDL 47 (H) 07/22/2022 0829   LDLCALC 181 (H) 07/22/2022 0829    Past Medical History:  Diagnosis Date   High cholesterol    PONV (postoperative nausea and vomiting)     Current Outpatient Medications on File Prior to Visit  Medication Sig Dispense Refill   apixaban (ELIQUIS) 5 MG TABS tablet Take 1 tablet (5 mg total) by mouth 2 (two) times daily. 60 tablet 6   metoprolol succinate  (TOPROL XL) 25 MG 24 hr tablet Take 0.5 tablets (12.5 mg total) by mouth daily. 15 tablet 6   Multiple Vitamins-Minerals (CENTRUM WOMEN PO) Take 1 tablet by mouth daily.     No current facility-administered medications on file prior to visit.    Allergies  Allergen Reactions   Codeine Nausea And Vomiting    Assessment/Plan:  1. Hyperlipidemia -  Problem  High Cholesterol   Current Medications: none  Intolerances: muscle aches affects mobility- rosuvastatin, ezetimibe atorvastatin  Risk Factors: 10 years ASCVD risk score 16.8 % ,family hx of premature ASCVD LDL goal: <70 mg/dl  Last LDLc 409     High cholesterol Assessment:  LDL goal: <70 mg/dl last LDLc 811 mg/dl (91/47/8295) 10 years ASCVD risk score 12-016.8% ; Moderate to high-intensity statin recommended because 10-year risk >7.5% Intolerance to statins and Zetia  : muscle aches affects mobility- rosuvastatin, ezetimibe atorvastatin  Discussed next potential options (PCSK-9 inhibitors, bempedoic acid and inclisiran); cost, dosing efficacy, side effects  Diet needs improvement - lower carb intake to lower TG, does not do regular exercise   Plan: Will start taking rosuvastatin low dose every other day  Reiterated importance of diet and exercise - willing to cut down on carb intake and will start using rowing machine 30 min every other day  Will apply for PA for PCSK9i; will inform patient upon approval Lipid lab due in 2-3 months after starting PCSK9i    Thank you,  Carmela Hurt, Pharm.D [facility] A Division of [facility] [address] [place], [place] [address]  Phone: [phone]; Fax: [phone]

## 2022-08-27 NOTE — Assessment & Plan Note (Addendum)
Assessment:  LDL goal: <70 mg/dl last LDLc 161 mg/dl (09/60/4540) 10 years ASCVD risk score 12-016.8% ; Moderate to high-intensity statin recommended because 10-year risk >7.5% Intolerance to statins and Zetia  : muscle aches affects mobility- rosuvastatin, ezetimibe atorvastatin  Discussed next potential options (PCSK-9 inhibitors, bempedoic acid and inclisiran); cost, dosing efficacy, side effects  Diet needs improvement - lower carb intake to lower TG, does not do regular exercise   Plan: Will start taking rosuvastatin low dose every other day  Reiterated importance of diet and exercise - willing to cut down on carb intake and will start using rowing machine 30 min every other day   Will apply for PA for PCSK9i; will inform patient upon approval Lipid lab due in 2-3 months after starting PCSK9i

## 2022-08-27 NOTE — Patient Instructions (Addendum)
Your Results:             Your most recent labs Goal  Total Cholesterol 276 < 200  Triglycerides 237 < 150  HDL (happy/good cholesterol) 48 > 40  LDL (lousy/bad cholesterol 181 < 70   Medication changes: We will start the process to get PCSK9i (Repatha or Praluent)  covered by your insurance.  Once the prior authorization is complete, we will call you to let you know and confirm pharmacy information.   Praluent is a cholesterol medication that improved your body's ability to get rid of "bad cholesterol" known as LDL. It can lower your LDL up to 60%. It is an injection that is given under the skin every 2 weeks. The most common side effects of Praluent include runny nose, symptoms of the common cold, rarely flu or flu-like symptoms, back/muscle pain in about 3-4% of the patients, and redness, pain, or bruising at the injection site.    Repatha is a cholesterol medication that improved your body's ability to get rid of "bad cholesterol" known as LDL. It can lower your LDL up to 60%! It is an injection that is given under the skin every 2 weeks. The most common side effects of Repatha include runny nose, symptoms of the common cold, rarely flu or flu-like symptoms, back/muscle pain in about 3-4% of the patients, and redness, pain, or bruising at the injection site.   Lab orders: We want to repeat labs after 2-3 months.  We will send you a lab order to remind you once we get closer to that time.

## 2022-08-27 NOTE — Telephone Encounter (Signed)
Pharmacy Patient Advocate Encounter   Received notification from [facility] that prior authorization for REPATHA 140MG /ML is required/requested.   PA submitted on 5.29.24 to (ins) [employer] via CoverMyMeds Key or (Medicaid) confirmation # [id number]  Status is pending

## 2022-08-28 ENCOUNTER — Other Ambulatory Visit: Payer: Self-pay | Admitting: Pharmacist

## 2022-08-28 DIAGNOSIS — E785 Hyperlipidemia, unspecified: Secondary | ICD-10-CM

## 2022-08-28 MED ORDER — REPATHA SURECLICK 140 MG/ML ~~LOC~~ SOAJ: 140.0000 mg | SUBCUTANEOUS | 3 refills | Status: DC

## 2022-08-28 NOTE — Telephone Encounter (Signed)
Patient has been informed about PA approval for Repatha and follow up lab in 2-3 months of starting Repatha. Patient reports she was on 10 mg rosuvastatin, could not take it every day due to myalgia so will try every other day and see if she could tolerate that dose.

## 2022-08-28 NOTE — Telephone Encounter (Signed)
Pharmacy Patient Advocate Encounter  Prior Authorization for REPATHA 140MG /ML has been approved by [employer] (ins).    KEY# [id number] Effective dates: 1.1.24 through 12.31.24

## 2022-09-01 ENCOUNTER — Encounter: Payer: Self-pay | Admitting: *Deleted

## 2022-09-01 ENCOUNTER — Ambulatory Visit (HOSPITAL_COMMUNITY)
Admission: RE | Admit: 2022-09-01 | Discharge: 2022-09-01 | Disposition: A | Discharge status: Home / Self Care | Payer: Medicare PPO | Source: Ambulatory Visit | Attending: Nurse Practitioner | Admitting: Nurse Practitioner

## 2022-09-01 DIAGNOSIS — R0789 Other chest pain: Secondary | ICD-10-CM | POA: Diagnosis not present

## 2022-09-01 LAB — EXERCISE TOLERANCE TEST
Angina Index: 0
Duke Treadmill Score: 7
Estimated workload: 9
Exercise duration (min): 6 min
Exercise duration (sec): 41 s
MPHR: 149 {beats}/min
Peak HR: 164 {beats}/min
Percent HR: 110 %
RPE: 14
Rest HR: 73 {beats}/min
ST Depression (mm): 0 mm

## 2022-09-02 MED ORDER — METOPROLOL SUCCINATE ER 25 MG PO TB24: 12.5000 mg | ORAL_TABLET | ORAL | 6 refills | Status: DC

## 2022-09-02 MED ORDER — ROSUVASTATIN CALCIUM 10 MG PO TABS: 10.0000 mg | ORAL_TABLET | Freq: Every day | ORAL | 3 refills | Status: DC

## 2022-09-02 NOTE — Telephone Encounter (Signed)
PA approved, see other encounter for more detail

## 2022-09-02 NOTE — Telephone Encounter (Signed)
Spoke to patient patient is in agreement to try Crestor 10 mg every day over taking 3 medications to lower LDLc. Out of refill on Crestor so sent prescription to [facility].   Patient is aware to go for follow up lab in 2-3 months

## 2022-09-02 NOTE — Addendum Note (Signed)
Addended by: Tylene Fantasia on: 09/02/2022 04:18 PM   Modules accepted: Orders

## 2022-09-11 DIAGNOSIS — Z1231 Encounter for screening mammogram for malignant neoplasm of breast: Secondary | ICD-10-CM

## 2022-09-18 ENCOUNTER — Encounter: Payer: Self-pay | Admitting: *Deleted

## 2022-09-23 ENCOUNTER — Ambulatory Visit
Admission: RE | Admit: 2022-09-23 | Discharge: 2022-09-23 | Disposition: A | Discharge status: Home / Self Care | Payer: Medicare PPO | Source: Ambulatory Visit | Attending: Family Medicine | Admitting: Family Medicine

## 2022-09-23 DIAGNOSIS — Z1231 Encounter for screening mammogram for malignant neoplasm of breast: Secondary | ICD-10-CM

## 2022-09-25 MED ORDER — EZETIMIBE 10 MG PO TABS: 10.0000 mg | ORAL_TABLET | Freq: Every day | ORAL | 3 refills | Status: DC

## 2022-09-25 NOTE — Addendum Note (Signed)
Addended by: Tylene Fantasia on: 09/25/2022 01:24 PM   Modules accepted: Orders

## 2022-09-25 NOTE — Telephone Encounter (Signed)
Called and spoke to patient. Due to myalgia and muscle weakness she would like to get off of Crestor. Will initiate ezetimibe and let her continue with Repatha 140 mg every 14 days.   Follow up lipid lab and LFT is due on 10/07/2022. Patient is aware.

## 2022-10-07 ENCOUNTER — Encounter: Payer: Self-pay | Admitting: Nurse Practitioner

## 2022-10-07 ENCOUNTER — Ambulatory Visit: Payer: Medicare PPO | Attending: Nurse Practitioner | Admitting: Nurse Practitioner

## 2022-10-07 VITALS — BP 126/88 | HR 71 | Ht 70.0 in | Wt 205.6 lb

## 2022-10-07 DIAGNOSIS — R002 Palpitations: Secondary | ICD-10-CM

## 2022-10-07 DIAGNOSIS — I471 Supraventricular tachycardia, unspecified: Secondary | ICD-10-CM | POA: Diagnosis not present

## 2022-10-07 DIAGNOSIS — I4892 Unspecified atrial flutter: Secondary | ICD-10-CM | POA: Diagnosis not present

## 2022-10-07 DIAGNOSIS — R001 Bradycardia, unspecified: Secondary | ICD-10-CM | POA: Diagnosis not present

## 2022-10-07 DIAGNOSIS — E785 Hyperlipidemia, unspecified: Secondary | ICD-10-CM

## 2022-10-07 NOTE — Progress Notes (Signed)
Cardiology Office Note:  .   Date:  10/07/2022  ID:  Ruth Mendoza, DOB October 09, 1950, MRN [id number] PCP: Assunta Found, MD  [facility] Providers Cardiologist:  Dina Rich, MD Cardiology APP:  Sharlene Dory, NP    History of Present Illness: .   Ruth Mendoza is a 72 y.o. female with a PMH of chest pain, palpitations, paroxysmal A-flutter/PSVT, HLD (statin intolerance), who presents today for scheduled follow-up.   I last saw patient on Aug 26, 2022. Admitted to labile heart rates. Denied any chest pain or palpitations. Previous monitor report noted below. Notified office that she was tolerating Toprol XL 12.5 mg every other day well. Previous ETT arranged was normal.   Today she presents for follow-up. She states she is doing better with reduced dose of Toprol XL. Followed up with Lipid Clinic, could not tolerate Crestor, now has started Zetia. Denies any chest pain, shortness of breath, palpitations, syncope, presyncope, dizziness, orthopnea, PND, swelling or significant weight changes, acute bleeding, or claudication. Does occasionally note HR in 40's while resting and also at night time.   Studies Reviewed: Marland Kitchen   EKG Interpretation Date/Time:  Tuesday October 07 2022 08:26:54 EDT Ventricular Rate:  70 PR Interval:  162 QRS Duration:  86 QT Interval:  402 QTC Calculation: 434 R Axis:   6  Text Interpretation: Normal sinus rhythm Normal ECG When compared with ECG of 05-Jul-2022 23:08, PREVIOUS ECG IS PRESENT Confirmed by Sharlene Dory [phone]) on 10/07/2022 8:31:23 AM   EKG Interpretation Date/Time:  Tuesday October 07 2022 08:26:54 EDT Ventricular Rate:  70 PR Interval:  162 QRS Duration:  86 QT Interval:  402 QTC Calculation: 434 R Axis:   6  Text Interpretation: Normal sinus rhythm Normal ECG When compared with ECG of 05-Jul-2022 23:08, PREVIOUS ECG IS PRESENT Confirmed by Sharlene Dory [phone]) on 10/07/2022 8:31:23 AM   ETT 08/2022   No ST deviation was noted.    Normal ETT Elevated BP response to exercise No significant arrhythmias  Cardiac monitor 08/2022:   30 day monitor   Min HR 39, Max HR 160, avg HR 80   No symptoms reported   Episodes of afib vs aflutter noted, at times with elevated rates to 120s-130s  Echo 03/2022:   1. Left ventricular ejection fraction, by estimation, is 60 to 65%. The  left ventricle has normal function. The left ventricle has no regional  wall motion abnormalities. There is mild left ventricular hypertrophy.  Left ventricular diastolic parameters  are consistent with Grade I diastolic dysfunction (impaired relaxation).  The average left ventricular global longitudinal strain is -19.5 %. The  global longitudinal strain is normal.   2. Right ventricular systolic function is normal. The right ventricular  size is normal.   3. The mitral valve is grossly normal. Trivial mitral valve  regurgitation. No evidence of mitral stenosis.   4. The aortic valve is tricuspid. Aortic valve regurgitation is not  visualized. No aortic stenosis is present.   5. The inferior vena cava is normal in size with greater than 50%  respiratory variability, suggesting right atrial pressure of 3 mmHg.   Comparison(s): No prior Echocardiogram.  Risk Assessment/Calculations:    CHA2DS2-VASc Score = 2   This indicates a 2.2% annual risk of stroke. The patient's score is based upon: CHF History: 0 HTN History: 0 Diabetes History: 0 Stroke History: 0 Vascular Disease History: 0 Age Score: 1 Gender Score: 1   Physical Exam:   VS:  BP  126/88   Pulse 71   Ht 5\' 10"  (1.778 m)   Wt 205 lb 9.6 oz (93.3 kg)   SpO2 95%   BMI 29.50 kg/m    Wt Readings from Last 3 Encounters:  10/07/22 205 lb 9.6 oz (93.3 kg)  08/26/22 210 lb 6.4 oz (95.4 kg)  07/08/22 207 lb 9.6 oz (94.2 kg)    GEN: Well nourished, well developed in no acute distress NECK: No JVD; No carotid bruits CARDIAC: S1/S2, RRR, no murmurs, rubs, gallops RESPIRATORY:   Clear to auscultation without rales, wheezing or rhonchi  ABDOMEN: Soft, non-tender, non-distended EXTREMITIES:  No edema; No deformity   ASSESSMENT AND PLAN: .    Paroxysmal A-flutter/PSVT, bradycardia, palpitations Denies any recent palpitations, but does admit occasional low HR's. 30-day event monitor revealed overall NSR, with asymptomatic fast episodes of A-fib/A-flutter, average HR normal with occasional drops in HR in 40's.  Discussed holding Toprol XL to see if this helps, pt declines. Will refer to EP for evaluation.  Continue Eliquis 5 mg BID, on appropriate dosage. ED precautions discussed.    2. HLD She has pending labs tomorrow. Followed by Lipid Clinic. Continue current medication regimen.   Dispo: Follow-up with me or APP in 2-3 months or sooner if anything changes.   Signed, Sharlene Dory, NP

## 2022-10-07 NOTE — Patient Instructions (Addendum)
Medication Instructions:  Your physician recommends that you continue on your current medications as directed. Please refer to the Current Medication list given to you today.  Labwork: none  Testing/Procedures: none  Follow-Up: Your physician recommends that you schedule a follow-up appointment in: Follow up in 2-3 months   Any Other Special Instructions Will Be Listed Below (If Applicable). Referral to Electrophysiology   If you need a refill on your cardiac medications before your next appointment, please call your pharmacy.

## 2022-10-08 DIAGNOSIS — E785 Hyperlipidemia, unspecified: Secondary | ICD-10-CM | POA: Diagnosis not present

## 2022-10-09 LAB — LIPID PANEL
Chol/HDL Ratio: 2.4 ratio (ref 0.0–4.4)
Cholesterol, Total: 137 mg/dL (ref 100–199)
HDL: 57 mg/dL (ref >39)
LDL Chol Calc (NIH): 55 mg/dL (ref 0–99)
Triglycerides: 144 mg/dL (ref 0–149)
VLDL Cholesterol Cal: 25 mg/dL (ref 5–40)

## 2022-10-10 ENCOUNTER — Telehealth: Payer: Self-pay | Admitting: Pharmacist

## 2022-10-10 NOTE — Telephone Encounter (Signed)
Called to discussed recent lipid lab result. Patient tolerating Repatha and Zetia well, LDLc level 55. Will continue with current therapy. Check lipid annually.

## 2022-10-21 ENCOUNTER — Encounter: Payer: Self-pay | Admitting: *Deleted

## 2022-10-24 ENCOUNTER — Encounter (INDEPENDENT_AMBULATORY_CARE_PROVIDER_SITE_OTHER): Payer: Self-pay | Admitting: *Deleted

## 2022-10-28 ENCOUNTER — Other Ambulatory Visit: Payer: Self-pay

## 2022-10-30 ENCOUNTER — Telehealth (INDEPENDENT_AMBULATORY_CARE_PROVIDER_SITE_OTHER): Payer: Self-pay | Admitting: Gastroenterology

## 2022-10-30 NOTE — Telephone Encounter (Signed)
Patient called the office stated she received her colonoscopy recall letter - stated she is having some heart issues - she will mail the questionnaire back but wants to hold off on scheduling the colonoscopy until she gets all of her problems solved - she will call the office back

## 2022-11-13 NOTE — Progress Notes (Signed)
Electrophysiology Office Note:    Date:  11/14/2022   ID:  Ruth Mendoza, DOB 05/17/1950, MRN [id number]  PCP:  Assunta Found, MD   [facility] Providers Cardiologist:  Dina Rich, MD Cardiology APP:  Sharlene Dory, NP     Referring MD: Sharlene Dory, NP   History of Present Illness:    Ruth Mendoza is a 72 y.o. female with a medical history significant for paroxysmal atrial flutter versus SVT, referred for arrhythmia management.     She has noticed occasional slow heart rates and palpitations.  She has worn 2 heart rhythm monitors in the past year.  Results of these monitors are detailed below.  The first monitor showed an irregular rapid narrow complex rhythm interpreted as atrial flutter.  Eliquis was started.  A second monitor was placed in May 2024.  It showed a total of 24 minutes of atrial arrhythmia over the 30-day monitoring..  Symptom episodes correlated with sinus rhythm, sometimes with occasional PACs and PACs, and on 1 occasion bradycardia with a heart rate of 46 bpm.       Today, she reports that she is doing very well.  She is not having palpitations.  She denies having any lightheadedness, presyncope, dizziness, paroxysmal fatigue suggestive of symptomatic bradycardia.  EKGs/Labs/Other Studies Reviewed Today:    Echocardiogram:  January 2024 LVEF 60 to 65%, mild LVH, grade 1 diastolic dysfunction.  Normal valvular function.   Monitors:  [employer] 30-day monitor May 2024 - my interpretation There was a total of 24 minutes of atrial arrhythmia during the 30-day monitoring period.  Rates were at times rapid, up to 150 bpm.  There were a few nocturnal bradycardia events  [employer] 8-day monitor - my interpretation Sinus rhythm, heart rate 56 249 bpm, average 92 bpm.  3.2% atrial ectopy.  The longest episode was about 3 minutes.  Rates are elevated at about 160 bpm during these episodes. Patient triggered events correlated with atrial  and ventricular ectopy.   Stress testing:  Exercise treadmill test September 01, 2022 Patient exercised for 6 minutes 41 seconds reaching a maximum heart rate of 164 bpm.  ECG negative for ischemia.  Advanced imaging:   Cardiac catherization    EKG:   EKG Interpretation Date/Time:  Friday November 14 2022 08:51:43 EDT Ventricular Rate:  62 PR Interval:  154 QRS Duration:  96 QT Interval:  424 QTC Calculation: 430 R Axis:   3  Text Interpretation: Normal sinus rhythm Normal ECG When compared with ECG of 07-Oct-2022 08:26, No significant change was found Confirmed by York Pellant [phone]) on 11/14/2022 9:02:17 AM     Physical Exam:    VS:  BP 114/80 (BP Location: Left Arm, Patient Position: Sitting, Cuff Size: Large)   Pulse 62   Ht 5\' 10"  (1.778 m)   Wt 206 lb 12.8 oz (93.8 kg)   SpO2 98%   BMI 29.67 kg/m     Wt Readings from Last 3 Encounters:  11/14/22 206 lb 12.8 oz (93.8 kg)  10/07/22 205 lb 9.6 oz (93.3 kg)  08/26/22 210 lb 6.4 oz (95.4 kg)     GEN: Well nourished, well developed in no acute distress CARDIAC: RRR, no murmurs, rubs, gallops RESPIRATORY:  Normal work of breathing MUSCULOSKELETAL: no edema    ASSESSMENT & PLAN:    Atrial arrhythmia She has had brief periods of atrial arrhythmia detected on her monitor.  The longest detected episode was less than 3 minutes.  I have not  seen definitive flutter waves and think the rhythm is more consistent with fibrillatory activity. She did not have symptom episodes associated with the atrial arrhythmia Because these documented arrhythmias have been very brief and subclinical, I think the risk of anticoagulation at this point outweighs the benefit with CHA2DS2-VASc 2 of 2 for age greater than 78 and female sex. I think ongoing monitoring is warranted and recommended an Apple watch, which she is planning to get.  I stressed the importance of activating atrial fibrillation monitoring  Bradycardia I suspect her  current watch has been inaccurate as most episodes that she labeled as slow heart rates on her monitor correlated with sinus tachycardia During the 1 episode in which her heart was 46 bpm, she did not have symptoms but was just concerned because her watch detected a slow heart rate She appears to have normal heart rate variation. No changes   Signed, Maurice Small, MD  11/14/2022 9:02 AM     [facility]

## 2022-11-14 ENCOUNTER — Encounter: Payer: Self-pay | Admitting: Cardiovascular Disease

## 2022-11-14 ENCOUNTER — Ambulatory Visit: Payer: Medicare PPO | Admitting: Cardiovascular Disease

## 2022-11-14 VITALS — BP 114/80 | HR 62 | Ht 70.0 in | Wt 206.8 lb

## 2022-11-14 DIAGNOSIS — I4892 Unspecified atrial flutter: Secondary | ICD-10-CM

## 2022-11-14 DIAGNOSIS — R002 Palpitations: Secondary | ICD-10-CM | POA: Diagnosis not present

## 2022-11-14 DIAGNOSIS — I471 Supraventricular tachycardia, unspecified: Secondary | ICD-10-CM

## 2022-11-14 DIAGNOSIS — E785 Hyperlipidemia, unspecified: Secondary | ICD-10-CM | POA: Diagnosis not present

## 2022-11-14 NOTE — Patient Instructions (Signed)
Medication Instructions:  STOP Eliquis *If you need a refill on your cardiac medications before your next appointment, please call your pharmacy*   Follow-Up: At [facility], you and your health needs are our priority.  As part of our continuing mission to provide you with exceptional heart care, we have created designated Provider Care Teams.  These Care Teams include your primary Cardiologist (physician) and Advanced Practice Providers (APPs -  Physician Assistants and Nurse Practitioners) who all work together to provide you with the care you need, when you need it.  We recommend signing up for the patient portal called "MyChart".  Sign up information is provided on this After Visit Summary.  MyChart is used to connect with patients for Virtual Visits (Telemedicine).  Patients are able to view lab/test results, encounter notes, upcoming appointments, etc.  Non-urgent messages can be sent to your provider as well.   To learn more about what you can do with MyChart, go to [email or web address].    Your next appointment:   1 year(s)  Provider:   York Pellant, MD   Other Instructions Dr Nelly Laurence also recommends getting an apple watch

## 2022-11-26 DIAGNOSIS — R001 Bradycardia, unspecified: Secondary | ICD-10-CM | POA: Diagnosis not present

## 2022-11-26 DIAGNOSIS — Z8249 Family history of ischemic heart disease and other diseases of the circulatory system: Secondary | ICD-10-CM | POA: Diagnosis not present

## 2022-11-26 DIAGNOSIS — I471 Supraventricular tachycardia, unspecified: Secondary | ICD-10-CM | POA: Diagnosis not present

## 2022-11-26 DIAGNOSIS — N1831 Chronic kidney disease, stage 3a: Secondary | ICD-10-CM | POA: Diagnosis not present

## 2022-11-26 DIAGNOSIS — R32 Unspecified urinary incontinence: Secondary | ICD-10-CM | POA: Diagnosis not present

## 2022-11-26 DIAGNOSIS — I129 Hypertensive chronic kidney disease with stage 1 through stage 4 chronic kidney disease, or unspecified chronic kidney disease: Secondary | ICD-10-CM | POA: Diagnosis not present

## 2022-11-26 DIAGNOSIS — I4892 Unspecified atrial flutter: Secondary | ICD-10-CM | POA: Diagnosis not present

## 2022-11-26 DIAGNOSIS — E785 Hyperlipidemia, unspecified: Secondary | ICD-10-CM | POA: Diagnosis not present

## 2022-11-26 DIAGNOSIS — E669 Obesity, unspecified: Secondary | ICD-10-CM | POA: Diagnosis not present

## 2022-11-28 ENCOUNTER — Encounter: Payer: Self-pay | Admitting: Cardiovascular Disease

## 2022-11-28 NOTE — Telephone Encounter (Signed)
Spoke with patient to follow up on My Chart message. Patient reports that her heart rate is consistently staying in the 40s and sometimes drops to 39. She states the medicare nurse that did her annual visit told her her heart rate was irregular in the 40s when he checked it manually. She states when rate is low she feels sluggish and tired. She wanted to make Dr Nelly Laurence aware.   Advised patient I would forward her concerns to Dr Nelly Laurence for review. Provided education on apple watch, Kardia mobile device and when to seek emergency care for symptomatic bradycardia. Patient verbalized understanding and had no questions.

## 2022-12-16 ENCOUNTER — Ambulatory Visit
Admission: EM | Admit: 2022-12-16 | Discharge: 2022-12-16 | Disposition: A | Discharge status: Home / Self Care | Payer: Medicare PPO | Attending: Family Medicine | Admitting: Family Medicine

## 2022-12-16 DIAGNOSIS — L237 Allergic contact dermatitis due to plants, except food: Secondary | ICD-10-CM | POA: Insufficient documentation

## 2022-12-16 DIAGNOSIS — J069 Acute upper respiratory infection, unspecified: Secondary | ICD-10-CM | POA: Insufficient documentation

## 2022-12-16 DIAGNOSIS — Z1152 Encounter for screening for COVID-19: Secondary | ICD-10-CM | POA: Diagnosis not present

## 2022-12-16 MED ORDER — TRIAMCINOLONE ACETONIDE 0.1 % EX CREA: 1.0000 | TOPICAL_CREAM | Freq: Two times a day (BID) | CUTANEOUS | 0 refills | Status: DC

## 2022-12-16 MED ORDER — METHYLPREDNISOLONE SODIUM SUCC 125 MG IJ SOLR
125.0000 mg | Freq: Once | INTRAMUSCULAR | Status: AC
  Administered 2022-12-16: 125 mg via INTRAMUSCULAR

## 2022-12-16 NOTE — ED Triage Notes (Signed)
Pt presents with possible poison oak on right arm. Area is red and itching.  Pt also has a cough that started last night with some drainage. Pt recently traveled to [place].

## 2022-12-16 NOTE — ED Provider Notes (Signed)
[facility]    CSN: [id number] Arrival date & time: 12/16/22  8295      History   Chief Complaint Chief Complaint  Patient presents with   Rash   Cough    HPI Ruth Mendoza is a 72 y.o. female.   Patient presenting today with itchy rash that started on the right arm and is now widespread across torso, left arm.  Recent exposure to poison oak.  Trying over-the-counter sprays and creams with no relief.  Denies known exposures to medications, detergents, foods and no throat itching or swelling, chest tightness, shortness of breath, nausea, vomiting.  She is also having postnasal drainage, scratchy throat starting this morning.  States she was traveling with some family and friends recently to [facility] that had cold symptoms as well.  Denies fever, chills, body aches, chest pain, shortness of breath.  So far not tried anything for the symptoms.    Past Medical History:  Diagnosis Date   Chest tightness    High cholesterol    Hyperlipidemia    Paroxysmal atrial flutter (HCC)    PONV (postoperative nausea and vomiting)     Patient Active Problem List   Diagnosis Date Noted   High cholesterol 08/27/2022   Epidermal cyst 08/02/2021   Special screening for malignant neoplasms, colon 07/30/2017    Past Surgical History:  Procedure Laterality Date   ABDOMINAL HYSTERECTOMY     BREAST BIOPSY     BREAST CYST EXCISION Left 30 yrs ago   BREAST SURGERY     CHOLECYSTECTOMY     COLONOSCOPY N/A 11/18/2017   Procedure: COLONOSCOPY;  Surgeon: Malissa Hippo, MD;  [facility];  Service: Endoscopy;  Laterality: N/A;  930   POLYPECTOMY  11/18/2017   Procedure: POLYPECTOMY;  Surgeon: Malissa Hippo, MD;  Location: [facility];  Service: Endoscopy;;  splenic flexure polyp cs, hepatic flexure polyp cs ascending colon polyp hs, distal sigmoid colon polyp cs, rectal polyp cs   TONSILLECTOMY      OB History     Gravida  3   Para  3   Term  3   Preterm       AB      Living  3      SAB      IAB      Ectopic      Multiple      Live Births  3            Home Medications    Prior to Admission medications   Medication Sig Start Date End Date Taking? Authorizing Provider  triamcinolone cream (KENALOG) 0.1 % Apply 1 Application topically 2 (two) times daily. 12/16/22  Yes Particia Nearing, PA-C  Evolocumab (REPATHA SURECLICK) 140 MG/ML SOAJ Inject 140 mg into the skin every 14 (fourteen) days. 08/28/22   Antoine Poche, MD  ezetimibe (ZETIA) 10 MG tablet Take 1 tablet (10 mg total) by mouth daily. 09/25/22 12/24/22  Antoine Poche, MD  Multiple Vitamins-Minerals (CENTRUM WOMEN PO) Take 1 tablet by mouth daily.    [provider]    Family History Family History  Problem Relation Age of Onset   Breast cancer Paternal Grandmother    Cancer Maternal Grandmother        stomach   Heart attack Maternal Grandfather    COPD Father    Alzheimer's disease Mother    Other Mother        has place in  lung   Atrial fibrillation Mother    Cancer Sister        kidney; kidney was removed   Hypertension Son    Arthritis Daughter    Colon cancer Neg Hx     Social History Social History   Tobacco Use   Smoking status: Never    Passive exposure: Never   Smokeless tobacco: Never  Vaping Use   Vaping status: Never Used  Substance Use Topics   Alcohol use: Yes    Comment: occassional   Drug use: Never     Allergies   Codeine   Review of Systems Review of Systems Per HPI  Physical Exam Triage Vital Signs ED Triage Vitals  Encounter Vitals Group     BP 12/16/22 0940 131/82     Systolic BP Percentile --      Diastolic BP Percentile --      Pulse Rate 12/16/22 0938 76     Resp 12/16/22 0938 18     Temp 12/16/22 0938 98.3 F (36.8 C)     Temp src --      SpO2 12/16/22 0938 95 %     Weight --      Height --      Head Circumference --      Peak Flow --      Pain Score 12/16/22 0937 0     Pain Loc  --      Pain Education --      Exclude from Growth Chart --    No data found.  Updated Vital Signs BP 131/82   Pulse 76   Temp 98.3 F (36.8 C)   Resp 18   SpO2 95%   Visual Acuity Right Eye Distance:   Left Eye Distance:   Bilateral Distance:    Right Eye Near:   Left Eye Near:    Bilateral Near:     Physical Exam Vitals and nursing note reviewed.  Constitutional:      Appearance: Normal appearance. She is not ill-appearing.  HENT:     Head: Atraumatic.     Right Ear: Tympanic membrane normal.     Left Ear: Tympanic membrane normal.     Nose: Rhinorrhea present.     Mouth/Throat:     Mouth: Mucous membranes are moist.     Pharynx: Oropharynx is clear. Posterior oropharyngeal erythema present.  Eyes:     Extraocular Movements: Extraocular movements intact.     Conjunctiva/sclera: Conjunctivae normal.  Cardiovascular:     Rate and Rhythm: Normal rate and regular rhythm.     Heart sounds: Normal heart sounds.  Pulmonary:     Effort: Pulmonary effort is normal.     Breath sounds: Normal breath sounds.  Musculoskeletal:        General: Normal range of motion.     Cervical back: Normal range of motion and neck supple.  Lymphadenopathy:     Cervical: No cervical adenopathy.  Skin:    General: Skin is warm and dry.     Findings: Rash present.     Comments: Erythematous maculopapular rash sporadic across bilateral arms, torso  Neurological:     Mental Status: She is alert and oriented to person, place, and time.  Psychiatric:        Mood and Affect: Mood normal.        Thought Content: Thought content normal.        Judgment: Judgment normal.      UC Treatments / Results  Labs (all labs ordered are listed, but only abnormal results are displayed) Labs Reviewed  SARS CORONAVIRUS 2 (TAT 6-24 HRS)    EKG   Radiology No results found.  Procedures Procedures (including critical care time)  Medications Ordered in UC Medications  methylPREDNISolone  sodium succinate (SOLU-MEDROL) 125 mg/2 mL injection 125 mg (125 mg Intramuscular Given 12/16/22 1004)    Initial Impression / Assessment and Plan / UC Course  I have reviewed the triage vital signs and the nursing notes.  Pertinent labs & imaging results that were available during my care of the patient were reviewed by me and considered in my medical decision making (see chart for details).     Vital signs and exam very reassuring today, suspect viral respiratory infection.  COVID testing pending, good candidate for molnupiravir if positive.  Discussed supportive care medications and home care.  IM Solu-Medrol given for poison oak dermatitis.  Triamcinolone cream to spot treat with.  Return for worsening symptoms.  Final Clinical Impressions(s) / UC Diagnoses   Final diagnoses:  Poison oak dermatitis  Viral URI   Discharge Instructions   None    ED Prescriptions     Medication Sig Dispense Auth. Provider   triamcinolone cream (KENALOG) 0.1 % Apply 1 Application topically 2 (two) times daily. 80 g Particia Nearing, [place]      PDMP not reviewed this encounter.   Particia Nearing, [place] 12/16/22 1011

## 2022-12-21 NOTE — Progress Notes (Unsigned)
Cardiology Office Note:  .   Date: 12/22/2022 ID:  Ruth Mendoza, DOB 1950/08/15, MRN [id number] PCP: Assunta Found, MD  [facility] Providers Cardiologist:  Dina Rich, MD Cardiology APP:  Sharlene Dory, NP  Electrophysiologist:  Maurice Small, MD    History of Present Illness: .   Ruth Mendoza is a 72 y.o. female with a PMH of chest pain, palpitations, paroxysmal A-flutter/PSVT, HLD (statin intolerance), who presents today for scheduled follow-up.   I last saw patient on October 07, 2022.  Was doing better on a reduced dose of Toprol-XL.  Followed up with the lipid clinic.  Stated she did occasionally note her heart rate in the 40s all resting and also at nighttime.  Saw Dr. Nelly Laurence on November 14, 2022.  Was felt that her documented atrial arrhythmias were very brief and subclinical and due to the risk of anticoagulation outweighing benefits, her Eliquis was stopped.  Recommended for her to obtain an Apple Watch to monitor her heart rhythm and felt her current watch was inaccurate.   Today she presents for follow-up. Doing well. Says she continues to feel when her heart rate goes low, but says she doesn't worry about it. Continues to monitor her heart rhythm on her new Apple Watch and says she has only noted 2 A-fib episodes during the past month. Says her heart rate gets in the low 40's/upper 30's in evening and with sitting, remains asymptomatic. Denies any chest pain, shortness of breath, palpitations, syncope, presyncope, dizziness, orthopnea, PND, swelling or significant weight changes, acute bleeding, or claudication.  SH: Negative. See HPI.    Studies Reviewed: .    ETT 08/2022   No ST deviation was noted.   Normal ETT Elevated BP response to exercise No significant arrhythmias  Cardiac monitor 08/2022:   30 day monitor   Min HR 39, Max HR 160, avg HR 80   No symptoms reported   Episodes of afib vs aflutter noted, at times with elevated rates to  120s-130s  Echo 03/2022:   1. Left ventricular ejection fraction, by estimation, is 60 to 65%. The  left ventricle has normal function. The left ventricle has no regional  wall motion abnormalities. There is mild left ventricular hypertrophy.  Left ventricular diastolic parameters  are consistent with Grade I diastolic dysfunction (impaired relaxation).  The average left ventricular global longitudinal strain is -19.5 %. The  global longitudinal strain is normal.   2. Right ventricular systolic function is normal. The right ventricular  size is normal.   3. The mitral valve is grossly normal. Trivial mitral valve  regurgitation. No evidence of mitral stenosis.   4. The aortic valve is tricuspid. Aortic valve regurgitation is not  visualized. No aortic stenosis is present.   5. The inferior vena cava is normal in size with greater than 50%  respiratory variability, suggesting right atrial pressure of 3 mmHg.   Comparison(s): No prior Echocardiogram.    Physical Exam:   VS:  BP 120/87   Pulse 89   Ht 5\' 10"  (1.778 m)   Wt 204 lb 6.4 oz (92.7 kg)   SpO2 95%   BMI 29.33 kg/m    Wt Readings from Last 3 Encounters:  12/22/22 204 lb 6.4 oz (92.7 kg)  11/14/22 206 lb 12.8 oz (93.8 kg)  10/07/22 205 lb 9.6 oz (93.3 kg)    GEN: Well nourished, well developed in no acute distress NECK: No JVD; No carotid bruits CARDIAC: S1/S2, RRR, no  murmurs, rubs, gallops RESPIRATORY:  Clear to auscultation without rales, wheezing or rhonchi  ABDOMEN: Soft, non-tender, non-distended EXTREMITIES:  No edema; No deformity   ASSESSMENT AND PLAN: .    PAF/ Paroxysmal A-flutter/PSVT, bradycardia Denies any recent palpitations, but does admit occasional low HR's seen on Apple watch.  Her previous monitor was reviewed by Dr. Nelly Laurence, stated there was no definitive flutter waves and thought rhythm was more consistent with fibrillatory activity.  Eliquis was discontinued as mentioned above (see HPI).  He  recommended obtaining an Apple Watch which she has done.  She has shown me occasional low heart rates detected at night and with sitting-has noticed 2 A-fib episodes-I am unable to see A-fib episodes on her Apple Watch.  I recommended Kardia mobile device for better detection and evaluation for bradycardia and A-fib.  She denies any concerning/red flag symptoms.  No medication changes at this time, not on any HR lowering medications.  Will continue to monitor. Follow-up with EP as scheduled. Care and ED precautions discussed.   2. HLD Most recent labs show LDL at 55 09/2022. Followed by Lipid Clinic. Continue Repatha and Zetia. Heart healthy diet and regular cardiovascular exercise encouraged.   Dispo: Follow-up with Dr. Dina Rich or APP in 6 months or sooner if anything changes.   Signed, Sharlene Dory, NP

## 2022-12-22 ENCOUNTER — Ambulatory Visit: Payer: Medicare PPO | Attending: Nurse Practitioner | Admitting: Nurse Practitioner

## 2022-12-22 ENCOUNTER — Encounter: Payer: Self-pay | Admitting: Nurse Practitioner

## 2022-12-22 VITALS — BP 120/87 | HR 89 | Ht 70.0 in | Wt 204.4 lb

## 2022-12-22 DIAGNOSIS — I480 Paroxysmal atrial fibrillation: Secondary | ICD-10-CM

## 2022-12-22 DIAGNOSIS — E785 Hyperlipidemia, unspecified: Secondary | ICD-10-CM | POA: Diagnosis not present

## 2022-12-22 DIAGNOSIS — I471 Supraventricular tachycardia, unspecified: Secondary | ICD-10-CM | POA: Diagnosis not present

## 2022-12-22 DIAGNOSIS — I4892 Unspecified atrial flutter: Secondary | ICD-10-CM

## 2022-12-22 DIAGNOSIS — R001 Bradycardia, unspecified: Secondary | ICD-10-CM

## 2022-12-22 NOTE — Patient Instructions (Addendum)

## 2023-04-30 DIAGNOSIS — Z1283 Encounter for screening for malignant neoplasm of skin: Secondary | ICD-10-CM | POA: Diagnosis not present

## 2023-04-30 DIAGNOSIS — D225 Melanocytic nevi of trunk: Secondary | ICD-10-CM | POA: Diagnosis not present

## 2023-06-01 DIAGNOSIS — Z029 Encounter for administrative examinations, unspecified: Secondary | ICD-10-CM | POA: Diagnosis not present

## 2023-06-01 DIAGNOSIS — Z0001 Encounter for general adult medical examination with abnormal findings: Secondary | ICD-10-CM | POA: Diagnosis not present

## 2023-06-01 DIAGNOSIS — Z6830 Body mass index (BMI) 30.0-30.9, adult: Secondary | ICD-10-CM | POA: Diagnosis not present

## 2023-06-01 DIAGNOSIS — E6609 Other obesity due to excess calories: Secondary | ICD-10-CM | POA: Diagnosis not present

## 2023-06-01 DIAGNOSIS — Z1331 Encounter for screening for depression: Secondary | ICD-10-CM | POA: Diagnosis not present

## 2023-06-01 DIAGNOSIS — E782 Mixed hyperlipidemia: Secondary | ICD-10-CM | POA: Diagnosis not present

## 2023-06-01 DIAGNOSIS — E663 Overweight: Secondary | ICD-10-CM | POA: Diagnosis not present

## 2023-06-01 DIAGNOSIS — E7849 Other hyperlipidemia: Secondary | ICD-10-CM | POA: Diagnosis not present

## 2023-06-01 DIAGNOSIS — I10 Essential (primary) hypertension: Secondary | ICD-10-CM | POA: Diagnosis not present

## 2023-06-01 DIAGNOSIS — I4892 Unspecified atrial flutter: Secondary | ICD-10-CM | POA: Diagnosis not present

## 2023-06-01 DIAGNOSIS — T466X5D Adverse effect of antihyperlipidemic and antiarteriosclerotic drugs, subsequent encounter: Secondary | ICD-10-CM | POA: Diagnosis not present

## 2023-06-01 DIAGNOSIS — R002 Palpitations: Secondary | ICD-10-CM | POA: Diagnosis not present

## 2023-06-04 ENCOUNTER — Telehealth (INDEPENDENT_AMBULATORY_CARE_PROVIDER_SITE_OTHER): Payer: Self-pay | Admitting: Gastroenterology

## 2023-06-04 NOTE — Telephone Encounter (Signed)
 Pt was sent questionnaire back in July and has not returned it. Will pt need new referral?

## 2023-06-04 NOTE — Telephone Encounter (Signed)
 No it was from the recall list, so we can resend it

## 2023-06-04 NOTE — Telephone Encounter (Signed)
 Pt contacted. Pt states she will return questionnaire. Will call pt to schedule once we receive questionnaire.

## 2023-06-17 ENCOUNTER — Telehealth (INDEPENDENT_AMBULATORY_CARE_PROVIDER_SITE_OTHER): Payer: Self-pay | Admitting: Gastroenterology

## 2023-06-17 NOTE — Telephone Encounter (Signed)
 Ok to schedule.  Room 1/2  Thanks,  Vista Lawman, MD Gastroenterology and Hepatology [facility]

## 2023-06-17 NOTE — Telephone Encounter (Signed)
 Who is your primary care physician: Dr.John Phillips Odor  Reasons for the colonoscopy:   Have you had a colonoscopy before?  Yes Dr.Rehman 5 years ago  Do you have family history of colon cancer? no  Previous colonoscopy with polyps removed? Yes Dr.Rehman 5 years ago  Do you have a history colorectal cancer?   no  Are you diabetic? If yes, Type 1 or Type 2?    no  Do you have a prosthetic or mechanical heart valve? no  Do you have a pacemaker/defibrillator?   no  Have you had endocarditis/atrial fibrillation? Yes a fib  Have you had joint replacement within the last 12 months?  no  Do you tend to be constipated or have to use laxatives? no  Do you have any history of drugs or alchohol?  no  Do you use supplemental oxygen?  no  Have you had a stroke or heart attack within the last 6 months? no  Do you take weight loss medication?  no  For female patients: have you had a hysterectomy?  yes                                     are you post menopausal?       yes                                            do you still have your menstrual cycle? no      Do you take any blood-thinning medications such as: (aspirin, warfarin, Plavix, Aggrenox)    If yes we need the name, milligram, dosage and who is prescribing doctor  Current Outpatient Medications on File Prior to Visit  Medication Sig Dispense Refill   ezetimibe (ZETIA) 10 MG tablet Take 1 tablet (10 mg total) by mouth daily. 90 tablet 3   Ginkgo Biloba 60 MG CAPS Take by mouth.     Multiple Vitamins-Minerals (CENTRUM WOMEN PO) Take 1 tablet by mouth daily.     Evolocumab (REPATHA SURECLICK) 140 MG/ML SOAJ Inject 140 mg into the skin every 14 (fourteen) days. (Patient not taking: Reported on 06/17/2023) 6 mL 3   triamcinolone cream (KENALOG) 0.1 % Apply 1 Application topically 2 (two) times daily. (Patient not taking: Reported on 06/17/2023) 80 g 0   No current facility-administered medications on file prior to visit.    Allergies   Allergen Reactions   Codeine Nausea And Vomiting     Pharmacy: [facility]  Primary Insurance Name:   Best number where you can be reached: [phone]  NOTE: I WAS AWAKE FOR MY LAST COLONOSCOPY, I GET VERY SICK WHEN I'M PUT UNDER

## 2023-06-18 MED ORDER — SUTAB 1479-225-188 MG PO TABS: ORAL_TABLET | ORAL | 0 refills | Status: DC

## 2023-06-18 NOTE — Addendum Note (Signed)
 Addended by: Marlowe Shores on: 06/18/2023 01:46 PM   Modules accepted: Orders

## 2023-06-18 NOTE — Telephone Encounter (Signed)
 Pt contacted and scheduled. Pt prefers pill prep due to becoming nauseated with liquid prep. Pt also states she may not want to be put to sleep for TCS due to becoming sick when under. Instructions mailed to pt. Prep sent pharmacy. [employer] approved TCS

## 2023-06-19 ENCOUNTER — Encounter: Payer: Self-pay | Admitting: *Deleted

## 2023-06-19 NOTE — Telephone Encounter (Signed)
 Questionnaire from recall, no referral needed

## 2023-06-23 ENCOUNTER — Ambulatory Visit: Payer: Medicare PPO | Attending: Cardiology | Admitting: Cardiology

## 2023-06-23 VITALS — BP 130/78 | HR 80 | Ht 70.0 in | Wt 203.4 lb

## 2023-06-23 DIAGNOSIS — I480 Paroxysmal atrial fibrillation: Secondary | ICD-10-CM | POA: Diagnosis not present

## 2023-06-23 DIAGNOSIS — R002 Palpitations: Secondary | ICD-10-CM | POA: Diagnosis not present

## 2023-06-23 DIAGNOSIS — E782 Mixed hyperlipidemia: Secondary | ICD-10-CM | POA: Diagnosis not present

## 2023-06-23 NOTE — Patient Instructions (Signed)
 Medication Instructions:  Continue all current medications.   Labwork: none  Testing/Procedures: none  Follow-Up: 6 months   Any Other Special Instructions Will Be Listed Below (If Applicable).   If you need a refill on your cardiac medications before your next appointment, please call your pharmacy.

## 2023-06-23 NOTE — Progress Notes (Signed)
 Clinical Summary Ms. Corson is a 73 y.o.female seen today for follow up of the following medical problems.   1.Palpitations/Paroxysmal aflutter/PSVT 01/2022 monitor - 8 day monitor   Occasional supraventricular ectopy in the form of isolated PACs, couplets, triplets. 9 runs of SVT longest 6 beats   Rare ventricular ectopy in the form of isolated PVCs, couplets   <1% burden of atrial flutter with average rate of 157 bpm.   Symptoms correlated with sinus rhythm, PVCs, PACs, shorts runs SVT  - low bp's on lopressor 12.5mg  bid, changed to toprol 12.5mg  daily. Also had issues with low bp's     - from notes seen by Dr Nelly Laurence, from review of monitor no clear flutter waves, low burden of atrial arrhythmias. Relatively low CHADS2Vasc score for a woman at 2, anticoag was stopped. - denies any recent palpitations - low bp's on metoprolol in the past      2. History of chest pain - previously seen at [facility] - 2015 Patient exercise 6 min 13 sec of standard protocol, approximately  . Normal blood pressure response, no symptoms. She achieved  100% of predicted heart rate. At peak heart rate 1mm of up  sloping ST depression. IMP. Negative exercise treadmill test  - no recent chest pains.    3. Hyperlipidemia - tolerates zetia - reports side effects on statins. Reports muscle aches on multiple statins.   - tolerating repatha well - 05/2023 TC 146 TG 187 HDL 56 LDL 59   - home bp 161W/96E   Past Medical History:  Diagnosis Date   Chest tightness    High cholesterol    Hyperlipidemia    Paroxysmal atrial flutter (HCC)    PONV (postoperative nausea and vomiting)      Allergies  Allergen Reactions   Codeine Nausea And Vomiting     Current Outpatient Medications  Medication Sig Dispense Refill   Evolocumab (REPATHA SURECLICK) 140 MG/ML SOAJ Inject 140 mg into the skin every 14 (fourteen) days. 6 mL 3   ezetimibe (ZETIA) 10 MG tablet Take 1 tablet (10 mg total) by mouth  daily. 90 tablet 3   Multiple Vitamins-Minerals (CENTRUM WOMEN PO) Take 1 tablet by mouth daily.     Sodium Sulfate-Mag Sulfate-KCl (SUTAB) [phone] MG TABS As directed 24 tablet 0   No current facility-administered medications for this visit.     Past Surgical History:  Procedure Laterality Date   ABDOMINAL HYSTERECTOMY     BREAST BIOPSY     BREAST CYST EXCISION Left 30 yrs ago   BREAST SURGERY     CHOLECYSTECTOMY     COLONOSCOPY N/A 11/18/2017   Procedure: COLONOSCOPY;  Surgeon: Malissa Hippo, MD;  [facility];  Service: Endoscopy;  Laterality: N/A;  930   POLYPECTOMY  11/18/2017   Procedure: POLYPECTOMY;  Surgeon: Malissa Hippo, MD;  [facility];  Service: Endoscopy;;  splenic flexure polyp cs, hepatic flexure polyp cs ascending colon polyp hs, distal sigmoid colon polyp cs, rectal polyp cs   TONSILLECTOMY       Allergies  Allergen Reactions   Codeine Nausea And Vomiting      Family History  Problem Relation Age of Onset   Breast cancer Paternal Grandmother    Cancer Maternal Grandmother        stomach   Heart attack Maternal Grandfather    COPD Father    Alzheimer's disease Mother    Other Mother  has place in lung   Atrial fibrillation Mother    Cancer Sister        kidney; kidney was removed   Hypertension Son    Arthritis Daughter    Colon cancer Neg Hx      Social History Ms. Drummer reports that she has never smoked. She has never been exposed to tobacco smoke. She has never used smokeless tobacco. Ms. Dipinto reports current alcohol use.   Physical Examination Vitals:   06/23/23 0900 06/23/23 0924  BP: (!) 140/96 130/78  Pulse: 80   SpO2: 98%    Filed Weights   06/23/23 0900  Weight: 203 lb 6.4 oz (92.3 kg)    Gen: resting comfortably, no acute distress HEENT: no scleral icterus, pupils equal round and reactive, no palptable cervical adenopathy,  CV: RRR, no mrg, no jvd Resp: Clear to auscultation  bilaterally GI: abdomen is soft, non-tender, non-distended, normal bowel sounds, no hepatosplenomegaly MSK: extremities are warm, no edema.  Skin: warm, no rash Neuro:  no focal deficits Psych: appropriate affect   Diagnostic Studies Jan 2024 echo 1. Left ventricular ejection fraction, by estimation, is 60 to 65%. The  left ventricle has normal function. The left ventricle has no regional  wall motion abnormalities. There is mild left ventricular hypertrophy.  Left ventricular diastolic parameters  are consistent with Grade I diastolic dysfunction (impaired relaxation).  The average left ventricular global longitudinal strain is -19.5 %. The  global longitudinal strain is normal.   2. Right ventricular systolic function is normal. The right ventricular  size is normal.   3. The mitral valve is grossly normal. Trivial mitral valve  regurgitation. No evidence of mitral stenosis.   4. The aortic valve is tricuspid. Aortic valve regurgitation is not  visualized. No aortic stenosis is present.   5. The inferior vena cava is normal in size with greater than 50%  respiratory variability, suggesting right atrial pressure of 3 mmHg.    01/2022 heart monitor   8 day monitor   Occasional supraventricular ectopy in the form of isolated PACs, couplets, triplets. 9 runs of SVT longest 6 beats   Rare ventricular ectopy in the form of isolated PVCs, couplets   <1% burden of atrial flutter with average rate of 157 bpm.   Symptoms correlated with sinus rhythm, PVCs, PACs, shorts runs SVT     Jan 2024 echo 1. Left ventricular ejection fraction, by estimation, is 60 to 65%. The  left ventricle has normal function. The left ventricle has no regional  wall motion abnormalities. There is mild left ventricular hypertrophy.  Left ventricular diastolic parameters  are consistent with Grade I diastolic dysfunction (impaired relaxation).  The average left ventricular global longitudinal strain is -19.5  %. The  global longitudinal strain is normal.   2. Right ventricular systolic function is normal. The right ventricular  size is normal.   3. The mitral valve is grossly normal. Trivial mitral valve  regurgitation. No evidence of mitral stenosis.   4. The aortic valve is tricuspid. Aortic valve regurgitation is not  visualized. No aortic stenosis is present.   5. The inferior vena cava is normal in size with greater than 50%  respiratory variability, suggesting right atrial pressure of 3 mmHg.      Assessment and Plan  1.Palpitations/PSVT/paroxysmal aflutter - no recent symptoms - low bp's on very low dose metoprolol now off - followed by EP, from last visit very low burden of atrial arrhythmias. CHADS2Vasc score 2, in  female patient only class IIB to consider anticoag. Given low burden and low stroke risk anticoag was stopped by Dr Nelly Laurence - EKG today shows NSR  2. HLD - intolerant to statins - at goal on repatha, continue current therapy      Antoine Poche, M.D.

## 2023-06-25 ENCOUNTER — Telehealth (INDEPENDENT_AMBULATORY_CARE_PROVIDER_SITE_OTHER): Payer: Self-pay | Admitting: Gastroenterology

## 2023-06-25 NOTE — Telephone Encounter (Signed)
 Pt left voicemail needing to reschedule TCS due to having a chance to go on vacation. Pt contacted and rescheduled to 07/16/23 at 8:30am. Will send updated instructions to pt.

## 2023-07-08 ENCOUNTER — Encounter: Payer: Self-pay | Admitting: Emergency Medicine

## 2023-07-08 ENCOUNTER — Other Ambulatory Visit: Payer: Self-pay

## 2023-07-08 ENCOUNTER — Ambulatory Visit
Admission: EM | Admit: 2023-07-08 | Discharge: 2023-07-08 | Disposition: A | Discharge status: Home / Self Care | Attending: Nurse Practitioner | Admitting: Nurse Practitioner

## 2023-07-08 DIAGNOSIS — J069 Acute upper respiratory infection, unspecified: Secondary | ICD-10-CM

## 2023-07-08 LAB — POCT INFLUENZA A/B
Influenza A, POC: NEGATIVE
Influenza B, POC: NEGATIVE

## 2023-07-08 MED ORDER — BENZONATATE 100 MG PO CAPS: 100.0000 mg | ORAL_CAPSULE | Freq: Three times a day (TID) | ORAL | 0 refills | Status: DC | PRN

## 2023-07-08 NOTE — Discharge Instructions (Addendum)
 You have a viral upper respiratory infection.  Symptoms should improve over the next week to 10 days.  If you develop chest pain or shortness of breath, go to the emergency room.  Influenza test is negative today.  Some things that can make you feel better are: - Increased rest - Increasing fluid with water/sugar free electrolytes - Acetaminophen and ibuprofen as needed for fever/pain - Salt water gargling, chloraseptic spray and throat lozenges - OTC guaifenesin (Mucinex) 600 mg twice daily for congestion - Saline sinus flushes or a neti pot - Humidifying the air -Tessalon Perles every 8 hours as needed for dry cough

## 2023-07-08 NOTE — ED Triage Notes (Addendum)
 Pt reports cough, intermittent fever x2 days. Has been taking otc medication with no change in symptoms.   Pt reports covid home test was negative last night.   Also had 1 leftover abx that was also taken last night.

## 2023-07-08 NOTE — ED Provider Notes (Signed)
 [facility]    CSN: [id number] Arrival date & time: 07/08/23  0803      History   Chief Complaint Chief Complaint  Patient presents with   Cough    HPI Ruth Mendoza is a 73 y.o. female.   Patient presents today with 2-day history of bodyaches and chills, temperature max 99.1 F, congested cough, chest tightness, runny and stuffy nose, sore throat, and fatigue.  She denies shortness of breath, headache, ear pain, abdominal pain, nausea/vomiting, diarrhea, and change in appetite.  Reports when she "spiked a fever", she had leftover amoxicillin and began taking it as she was concerned for infection.  Has also been taking over-the-counter allergy pills, NyQuil, and cough drops with minimal temporary improvement.  Reports she was exposed to her son-in-law who had bronchitis a couple of weeks ago, no other known sick contacts.  Patient reports she performed a COVID-19 test at home that was negative.    Past Medical History:  Diagnosis Date   Chest tightness    High cholesterol    Hyperlipidemia    Paroxysmal atrial flutter (HCC)    PONV (postoperative nausea and vomiting)     Patient Active Problem List   Diagnosis Date Noted   High cholesterol 08/27/2022   Epidermal cyst 08/02/2021   Special screening for malignant neoplasms, colon 07/30/2017    Past Surgical History:  Procedure Laterality Date   ABDOMINAL HYSTERECTOMY     BREAST BIOPSY     BREAST CYST EXCISION Left 30 yrs ago   BREAST SURGERY     CHOLECYSTECTOMY     COLONOSCOPY N/A 11/18/2017   Procedure: COLONOSCOPY;  Surgeon: Malissa Hippo, MD;  Location: [facility];  Service: Endoscopy;  Laterality: N/A;  930   POLYPECTOMY  11/18/2017   Procedure: POLYPECTOMY;  Surgeon: Malissa Hippo, MD;  Location: [facility];  Service: Endoscopy;;  splenic flexure polyp cs, hepatic flexure polyp cs ascending colon polyp hs, distal sigmoid colon polyp cs, rectal polyp cs   TONSILLECTOMY      OB History      Gravida  3   Para  3   Term  3   Preterm      AB      Living  3      SAB      IAB      Ectopic      Multiple      Live Births  3            Home Medications    Prior to Admission medications   Medication Sig Start Date End Date Taking? Authorizing Provider  benzonatate (TESSALON) 100 MG capsule Take 1 capsule (100 mg total) by mouth 3 (three) times daily as needed for cough. Do not take with alcohol or while operating or driving heavy machinery 12/03/60  Yes Valentino Nose, NP  Evolocumab (REPATHA SURECLICK) 140 MG/ML SOAJ Inject 140 mg into the skin every 14 (fourteen) days. 08/28/22   Antoine Poche, MD  ezetimibe (ZETIA) 10 MG tablet Take 1 tablet (10 mg total) by mouth daily. 09/25/22 06/23/23  Antoine Poche, MD  Multiple Vitamins-Minerals (CENTRUM WOMEN PO) Take 1 tablet by mouth daily.    [provider]  Sodium Sulfate-Mag Sulfate-KCl (SUTAB) [phone] MG TABS As directed 06/18/23   Franky Macho, MD    Family History Family History  Problem Relation Age of Onset   Breast cancer Paternal Grandmother    Cancer Maternal  Grandmother        stomach   Heart attack Maternal Grandfather    COPD Father    Alzheimer's disease Mother    Other Mother        has place in lung   Atrial fibrillation Mother    Cancer Sister        kidney; kidney was removed   Hypertension Son    Arthritis Daughter    Colon cancer Neg Hx     Social History Social History   Tobacco Use   Smoking status: Never    Passive exposure: Never   Smokeless tobacco: Never  Vaping Use   Vaping status: Never Used  Substance Use Topics   Alcohol use: Yes    Comment: occassional   Drug use: Never     Allergies   Codeine   Review of Systems Review of Systems Per HPI  Physical Exam Triage Vital Signs ED Triage Vitals  Encounter Vitals Group     BP 07/08/23 0823 120/75     Systolic BP Percentile --      Diastolic BP Percentile --      Pulse  Rate 07/08/23 0823 76     Resp 07/08/23 0823 20     Temp 07/08/23 0823 97.9 F (36.6 C)     Temp Source 07/08/23 0823 Oral     SpO2 07/08/23 0823 96 %     Weight --      Height --      Head Circumference --      Peak Flow --      Pain Score 07/08/23 0822 3     Pain Loc --      Pain Education --      Exclude from Growth Chart --    No data found.  Updated Vital Signs BP 120/75 (BP Location: Right Arm)   Pulse 76   Temp 97.9 F (36.6 C) (Oral)   Resp 20   SpO2 96%   Visual Acuity Right Eye Distance:   Left Eye Distance:   Bilateral Distance:    Right Eye Near:   Left Eye Near:    Bilateral Near:     Physical Exam Vitals and nursing note reviewed.  Constitutional:      General: She is not in acute distress.    Appearance: Normal appearance. She is not ill-appearing or toxic-appearing.  HENT:     Head: Normocephalic and atraumatic.     Right Ear: Tympanic membrane, ear canal and external ear normal.     Left Ear: Tympanic membrane, ear canal and external ear normal.     Nose: No congestion or rhinorrhea.     Mouth/Throat:     Mouth: Mucous membranes are moist.     Pharynx: Oropharynx is clear. Posterior oropharyngeal erythema and postnasal drip present. No oropharyngeal exudate.  Eyes:     General: No scleral icterus.    Extraocular Movements: Extraocular movements intact.  Cardiovascular:     Rate and Rhythm: Normal rate and regular rhythm.  Pulmonary:     Effort: Pulmonary effort is normal. No respiratory distress.     Breath sounds: Normal breath sounds. No wheezing, rhonchi or rales.  Musculoskeletal:     Cervical back: Normal range of motion and neck supple.  Lymphadenopathy:     Cervical: No cervical adenopathy.  Skin:    General: Skin is warm and dry.     Coloration: Skin is not jaundiced or pale.     Findings: No  erythema or rash.  Neurological:     Mental Status: She is alert and oriented to person, place, and time.  Psychiatric:        Behavior:  Behavior is cooperative.      UC Treatments / Results  Labs (all labs ordered are listed, but only abnormal results are displayed) Labs Reviewed  POCT INFLUENZA A/B    EKG   Radiology No results found.  Procedures Procedures (including critical care time)  Medications Ordered in UC Medications - No data to display  Initial Impression / Assessment and Plan / UC Course  I have reviewed the triage vital signs and the nursing notes.  Pertinent labs & imaging results that were available during my care of the patient were reviewed by me and considered in my medical decision making (see chart for details).   Patient is well-appearing, normotensive, afebrile, not tachycardic, not tachypneic, oxygenating well on room air.    1. Viral URI with cough Vitals and exam are reassuring today Suspect viral etiology-influenza testing is negative today and patient reports negative COVID-19 testing at home Recommended discontinuing use of leftover amoxicillin Supportive care discussed with patient, start guaifenesin, cough suppressant medication ER and return precautions discussed  The patient was given the opportunity to ask questions.  All questions answered to their satisfaction.  The patient is in agreement to this plan.   Final Clinical Impressions(s) / UC Diagnoses   Final diagnoses:  Viral URI with cough     Discharge Instructions      You have a viral upper respiratory infection.  Symptoms should improve over the next week to 10 days.  If you develop chest pain or shortness of breath, go to the emergency room.  Influenza test is negative today.  Some things that can make you feel better are: - Increased rest - Increasing fluid with water/sugar free electrolytes - Acetaminophen and ibuprofen as needed for fever/pain - Salt water gargling, chloraseptic spray and throat lozenges - OTC guaifenesin (Mucinex) 600 mg twice daily for congestion - Saline sinus flushes or a neti  pot - Humidifying the air -Tessalon Perles every 8 hours as needed for dry cough      ED Prescriptions     Medication Sig Dispense Auth. Provider   benzonatate (TESSALON) 100 MG capsule Take 1 capsule (100 mg total) by mouth 3 (three) times daily as needed for cough. Do not take with alcohol or while operating or driving heavy machinery 21 capsule Valentino Nose, NP      PDMP not reviewed this encounter.   Valentino Nose, NP 07/08/23 [phone]

## 2023-07-10 ENCOUNTER — Telehealth (INDEPENDENT_AMBULATORY_CARE_PROVIDER_SITE_OTHER): Payer: Self-pay | Admitting: Gastroenterology

## 2023-07-10 NOTE — Telephone Encounter (Signed)
 Pt left voicemail stating that she has developed an upper respiratory infection. Pt running low grade fever of 99 at this time. Pt is going back to Urgent Care today. Pt wanted to know if she would be ok to continue with procedure on 07/16/23. Advised pt at this time we will keep her on but if develops a high fever or if she begins to feel worse, to let us know. Advised pt I would be out of office on 4/15 if she was to happen to call that day. Pt has telephone pre op that day at 9:30am. Pt states she will relay to them what is going on.

## 2023-07-13 DIAGNOSIS — Z6830 Body mass index (BMI) 30.0-30.9, adult: Secondary | ICD-10-CM | POA: Diagnosis not present

## 2023-07-13 DIAGNOSIS — E6609 Other obesity due to excess calories: Secondary | ICD-10-CM | POA: Diagnosis not present

## 2023-07-13 DIAGNOSIS — J45909 Unspecified asthma, uncomplicated: Secondary | ICD-10-CM | POA: Diagnosis not present

## 2023-07-13 DIAGNOSIS — I10 Essential (primary) hypertension: Secondary | ICD-10-CM | POA: Diagnosis not present

## 2023-07-13 DIAGNOSIS — J0100 Acute maxillary sinusitis, unspecified: Secondary | ICD-10-CM | POA: Diagnosis not present

## 2023-07-13 NOTE — Telephone Encounter (Signed)
 Pt left VM and stated she has bronchitis and pneumonia. Needs to reschedule upcoming procedure. She left return # as [phone]

## 2023-07-13 NOTE — Telephone Encounter (Signed)
 Pt contacted and rescheduled to 07/27/23 at 12:15pm. Will send new instructions.

## 2023-07-14 ENCOUNTER — Encounter (HOSPITAL_COMMUNITY): Admission: RE | Admit: 2023-07-14 | Source: Ambulatory Visit

## 2023-07-16 ENCOUNTER — Other Ambulatory Visit: Payer: Self-pay | Admitting: Cardiology

## 2023-07-18 ENCOUNTER — Emergency Department (HOSPITAL_COMMUNITY)

## 2023-07-18 ENCOUNTER — Other Ambulatory Visit: Payer: Self-pay

## 2023-07-18 ENCOUNTER — Emergency Department (HOSPITAL_COMMUNITY)
Admission: EM | Admit: 2023-07-18 | Discharge: 2023-07-18 | Disposition: A | Discharge status: Home / Self Care | Attending: Emergency Medicine | Admitting: Emergency Medicine

## 2023-07-18 ENCOUNTER — Encounter (HOSPITAL_COMMUNITY): Payer: Self-pay | Admitting: Emergency Medicine

## 2023-07-18 DIAGNOSIS — R0602 Shortness of breath: Secondary | ICD-10-CM | POA: Insufficient documentation

## 2023-07-18 DIAGNOSIS — R0789 Other chest pain: Secondary | ICD-10-CM | POA: Diagnosis not present

## 2023-07-18 DIAGNOSIS — J069 Acute upper respiratory infection, unspecified: Secondary | ICD-10-CM | POA: Diagnosis not present

## 2023-07-18 DIAGNOSIS — R079 Chest pain, unspecified: Secondary | ICD-10-CM | POA: Diagnosis not present

## 2023-07-18 DIAGNOSIS — R0689 Other abnormalities of breathing: Secondary | ICD-10-CM | POA: Diagnosis not present

## 2023-07-18 DIAGNOSIS — I700 Atherosclerosis of aorta: Secondary | ICD-10-CM | POA: Diagnosis not present

## 2023-07-18 LAB — CBC WITH DIFFERENTIAL/PLATELET
Abs Immature Granulocytes: 0.16 10*3/uL — ABNORMAL HIGH (ref 0.00–0.07)
Basophils Absolute: 0.1 10*3/uL (ref 0.0–0.1)
Basophils Relative: 1 %
Eosinophils Absolute: 0.1 10*3/uL (ref 0.0–0.5)
Eosinophils Relative: 1 %
HCT: 50.6 % — ABNORMAL HIGH (ref 36.0–46.0)
Hemoglobin: 16.7 g/dL — ABNORMAL HIGH (ref 12.0–15.0)
Immature Granulocytes: 2 %
Lymphocytes Relative: 20 %
Lymphs Abs: 2.1 10*3/uL (ref 0.7–4.0)
MCH: 30.3 pg (ref 26.0–34.0)
MCHC: 33 g/dL (ref 30.0–36.0)
MCV: 91.8 fL (ref 80.0–100.0)
Monocytes Absolute: 0.7 10*3/uL (ref 0.1–1.0)
Monocytes Relative: 7 %
Neutro Abs: 7.6 10*3/uL (ref 1.7–7.7)
Neutrophils Relative %: 69 %
Platelets: 274 10*3/uL (ref 150–400)
RBC: 5.51 MIL/uL — ABNORMAL HIGH (ref 3.87–5.11)
RDW: 13.1 % (ref 11.5–15.5)
WBC: 10.8 10*3/uL — ABNORMAL HIGH (ref 4.0–10.5)
nRBC: 0 % (ref 0.0–0.2)

## 2023-07-18 LAB — D-DIMER, QUANTITATIVE: D-Dimer, Quant: 1.23 ug{FEU}/mL — ABNORMAL HIGH (ref 0.00–0.50)

## 2023-07-18 LAB — COMPREHENSIVE METABOLIC PANEL WITH GFR
ALT: 36 U/L (ref 0–44)
AST: 24 U/L (ref 15–41)
Albumin: 3.7 g/dL (ref 3.5–5.0)
Alkaline Phosphatase: 76 U/L (ref 38–126)
Anion gap: 10 (ref 5–15)
BUN: 42 mg/dL — ABNORMAL HIGH (ref 8–23)
CO2: 21 mmol/L — ABNORMAL LOW (ref 22–32)
Calcium: 9.2 mg/dL (ref 8.9–10.3)
Chloride: 107 mmol/L (ref 98–111)
Creatinine, Ser: 1.13 mg/dL — ABNORMAL HIGH (ref 0.44–1.00)
GFR, Estimated: 52 mL/min — ABNORMAL LOW (ref >60)
Glucose, Bld: 109 mg/dL — ABNORMAL HIGH (ref 70–99)
Potassium: 4.2 mmol/L (ref 3.5–5.1)
Sodium: 138 mmol/L (ref 135–145)
Total Bilirubin: 0.8 mg/dL (ref 0.0–1.2)
Total Protein: 6.9 g/dL (ref 6.5–8.1)

## 2023-07-18 LAB — TROPONIN I (HIGH SENSITIVITY)
Troponin I (High Sensitivity): <2 ng/L (ref <18)
Troponin I (High Sensitivity): <2 ng/L (ref <18)

## 2023-07-18 LAB — RESP PANEL BY RT-PCR (RSV, FLU A&B, COVID)  RVPGX2
Influenza A by PCR: NEGATIVE
Influenza B by PCR: NEGATIVE
Resp Syncytial Virus by PCR: NEGATIVE
SARS Coronavirus 2 by RT PCR: NEGATIVE

## 2023-07-18 MED ORDER — IOHEXOL 350 MG/ML SOLN
75.0000 mL | Freq: Once | INTRAVENOUS | Status: AC | PRN
  Administered 2023-07-18: 75 mL via INTRAVENOUS

## 2023-07-18 MED ORDER — LEVALBUTEROL TARTRATE 45 MCG/ACT IN AERO: 1.0000 | INHALATION_SPRAY | Freq: Four times a day (QID) | RESPIRATORY_TRACT | 12 refills | Status: DC | PRN

## 2023-07-18 NOTE — Discharge Instructions (Addendum)
 Please start using the Zyrtec  10 mg daily  Xopenex  inhaler every 6 hours as needed  Follow-up with your family doctor and the pulmonary doctor which I have listed above.  You can call the office of Dr. Waymond Hailey in the pulmonary clinic to be seen within the next 1 to 2 weeks.  Thank you for allowing us  to treat you in the emergency department today.  After reviewing your examination and potential testing that was done it appears that you are safe to go home.  I would like for you to follow-up with your doctor within the next several days, have them obtain your records and follow-up with them to review all potential tests and results from your visit.  If you should develop severe or worsening symptoms return to the emergency department immediately

## 2023-07-18 NOTE — ED Triage Notes (Signed)
 Pt to ER states she has had a "respiratory infection" for last two weeks. Was seen at UC 2 weeks ago and treated for URI.  Saw PCP on Monday and was treated with steroids, abx for presumptive PNA.  Pt states this AM she is worsening SHOB, chest tightness and diffiuclty breathing.

## 2023-07-18 NOTE — ED Notes (Addendum)
 Walked around nurses desk and O2 sat was 92-96. Pt did not complain of any dizziness, or SOB. MD notified.

## 2023-07-18 NOTE — ED Provider Notes (Signed)
 [place] [facility] Provider Note   CSN: [id number] Arrival date & time: 07/18/23  1059     History  Chief Complaint  Patient presents with   Shortness of Breath   Chest Pain    Ruth Mendoza is a 73 y.o. female.  Patient complains of shortness of breath for a number of days.  She has been treated for a URI.  Still complains of dyspnea on exertion  The history is provided by the patient and medical records. No language interpreter was used.  Shortness of Breath Severity:  Moderate Onset quality:  Sudden Timing:  Constant Progression:  Worsening Chronicity:  New Context: activity   Relieved by:  Nothing Worsened by:  Nothing Ineffective treatments:  None tried Associated symptoms: chest pain   Associated symptoms: no abdominal pain, no cough, no headaches and no rash   Chest Pain Associated symptoms: shortness of breath   Associated symptoms: no abdominal pain, no back pain, no cough, no fatigue and no headache        Home Medications Prior to Admission medications   Medication Sig Start Date End Date Taking? Authorizing Provider  benzonatate  (TESSALON ) 100 MG capsule Take 1 capsule (100 mg total) by mouth 3 (three) times daily as needed for cough. Do not take with alcohol or while operating or driving heavy machinery 4/0/98   Wilhemena Harbour, NP  ezetimibe  (ZETIA ) 10 MG tablet Take 1 tablet (10 mg total) by mouth daily. 09/25/22 06/23/23  Laurann Pollock, MD  Multiple Vitamins-Minerals (CENTRUM WOMEN PO) Take 1 tablet by mouth daily.    [provider]  REPATHA  SURECLICK 140 MG/ML SOAJ INJECT 140 MG INTO THE SKIN EVERY 14 DAYS 07/16/23   Laurann Pollock, MD  Sodium Sulfate-Mag Sulfate-KCl (SUTAB ) [phone] MG TABS As directed 06/18/23   Hargis Lias, MD      Allergies    Codeine    Review of Systems   Review of Systems  Constitutional:  Negative for appetite change and fatigue.  HENT:  Negative for  congestion, ear discharge and sinus pressure.   Eyes:  Negative for discharge.  Respiratory:  Positive for shortness of breath. Negative for cough.   Cardiovascular:  Positive for chest pain.  Gastrointestinal:  Negative for abdominal pain and diarrhea.  Genitourinary:  Negative for frequency and hematuria.  Musculoskeletal:  Negative for back pain.  Skin:  Negative for rash.  Neurological:  Negative for seizures and headaches.  Psychiatric/Behavioral:  Negative for hallucinations.     Physical Exam Updated Vital Signs BP 129/85   Pulse (!) 58   Temp 97.7 F (36.5 C)   Resp 13   Ht 5\' 9"  (1.753 m)   Wt 90.7 kg   SpO2 95%   BMI 29.53 kg/m  Physical Exam Vitals and nursing note reviewed.  Constitutional:      Appearance: She is well-developed.  HENT:     Head: Normocephalic.     Nose: Nose normal.  Eyes:     General: No scleral icterus.    Conjunctiva/sclera: Conjunctivae normal.  Neck:     Thyroid : No thyromegaly.  Cardiovascular:     Rate and Rhythm: Normal rate and regular rhythm.     Heart sounds: No murmur heard.    No friction rub. No gallop.  Pulmonary:     Breath sounds: No stridor. No wheezing or rales.  Chest:     Chest wall: No tenderness.  Abdominal:  General: There is no distension.     Tenderness: There is no abdominal tenderness. There is no rebound.  Musculoskeletal:        General: Normal range of motion.     Cervical back: Neck supple.  Lymphadenopathy:     Cervical: No cervical adenopathy.  Skin:    Findings: No erythema or rash.  Neurological:     Mental Status: She is alert and oriented to person, place, and time.     Motor: No abnormal muscle tone.     Coordination: Coordination normal.  Psychiatric:        Behavior: Behavior normal.     ED Results / Procedures / Treatments   Labs (all labs ordered are listed, but only abnormal results are displayed) Labs Reviewed  CBC WITH DIFFERENTIAL/PLATELET - Abnormal; Notable for the  following components:      Result Value   WBC 10.8 (*)    RBC 5.51 (*)    Hemoglobin 16.7 (*)    HCT 50.6 (*)    Abs Immature Granulocytes 0.16 (*)    All other components within normal limits  COMPREHENSIVE METABOLIC PANEL WITH GFR - Abnormal; Notable for the following components:   CO2 21 (*)    Glucose, Bld 109 (*)    BUN 42 (*)    Creatinine, Ser 1.13 (*)    GFR, Estimated 52 (*)    All other components within normal limits  D-DIMER, QUANTITATIVE - Abnormal; Notable for the following components:   D-Dimer, Quant 1.23 (*)    All other components within normal limits  RESP PANEL BY RT-PCR (RSV, FLU A&B, COVID)  RVPGX2  TROPONIN I (HIGH SENSITIVITY)  TROPONIN I (HIGH SENSITIVITY)    EKG None  Radiology No results found.  Procedures Procedures    Medications Ordered in ED Medications  iohexol  (OMNIPAQUE ) 350 MG/ML injection 75 mL (75 mLs Intravenous Contrast Given 07/18/23 1416)    ED Course/ Medical Decision Making/ A&P                                 Medical Decision Making Amount and/or Complexity of Data Reviewed Labs: ordered. Radiology: ordered. ECG/medicine tests: ordered.  Risk Prescription drug management.   Pt with sob.   Unknown cause with no pe on ct chest.  She is referred to pulmonary        Final Clinical Impression(s) / ED Diagnoses Final diagnoses:  None    Rx / DC Orders ED Discharge Orders     None         Cheyenne Cotta, MD 07/21/23 1113

## 2023-07-18 NOTE — ED Provider Notes (Signed)
 At change of shift, care signed out from Dr. Bryna Car to follow-up results and disposition.  The patient has had a CT scan of her chest which did not show pulmonary embolism but there was a concern about the aortic filling and concern for dissection.  A dissection study was obtained and was unremarkable.  The patient has normal vital signs, her rest of her workup is been unremarkable, she is agreeable to follow-up and will be referred to pulmonology.  Evidently she had been prescribed oral albuterol  but was concerned that she may go into A-fib when taking it so she has not been using it.  I have prescribed Xopenex  after telling the patient that this was less likely to cause those symptoms, she is agreeable to give it a try.  She is not in A-fib at this time.  Stable for discharge   Early Glisson, MD 07/18/23 2027

## 2023-07-20 ENCOUNTER — Encounter: Payer: Self-pay | Admitting: Cardiology

## 2023-07-22 ENCOUNTER — Telehealth: Payer: Self-pay | Admitting: Cardiology

## 2023-07-22 NOTE — Telephone Encounter (Signed)
 Spoke with pt and informed of Dr. Junita Oliva response. Pt thankful for the return call.

## 2023-07-22 NOTE — Telephone Encounter (Signed)
 From the ER notes looks like symptoms were more lung related as opposed to heart related and the ER team was going to refer her to pulmonary which would seem like the best plan. The heart testing they did looked fine while in the ER. I would f/u with pcp and also pulmonary  Letta Raw MD

## 2023-07-22 NOTE — Telephone Encounter (Signed)
 Pt is requesting a callback regarding her MyChart message she sent on Monday but still hasn't heard anything yet. She'd like to be advised on what to do. Please advise  "Dr Amanda Jungling.  I went to the hospital Saturday after a 2 week bout of upper respiratory having tightness and trouble breathing. Had previously been treated at urgent care and [facility].  After running blood work they found my d-Dimer Quant was high so they checked for blood clots. (Along with other abnormal readings) They prescribed me an inhaler which helped tremendously but do I need to follow up with you?  The test results are in my chart. "

## 2023-07-27 ENCOUNTER — Ambulatory Visit (HOSPITAL_COMMUNITY): Admitting: Anesthesiology

## 2023-07-27 ENCOUNTER — Other Ambulatory Visit: Payer: Self-pay

## 2023-07-27 ENCOUNTER — Encounter (INDEPENDENT_AMBULATORY_CARE_PROVIDER_SITE_OTHER): Payer: Self-pay | Admitting: *Deleted

## 2023-07-27 ENCOUNTER — Encounter (HOSPITAL_COMMUNITY)
Admission: RE | Disposition: A | Discharge status: Home / Self Care | Payer: Self-pay | Source: Home / Self Care | Attending: Gastroenterology

## 2023-07-27 ENCOUNTER — Ambulatory Visit (HOSPITAL_BASED_OUTPATIENT_CLINIC_OR_DEPARTMENT_OTHER): Admitting: Anesthesiology

## 2023-07-27 ENCOUNTER — Ambulatory Visit (HOSPITAL_COMMUNITY)
Admission: RE | Admit: 2023-07-27 | Discharge: 2023-07-27 | Disposition: A | Discharge status: Home / Self Care | Attending: Gastroenterology | Admitting: Gastroenterology

## 2023-07-27 ENCOUNTER — Encounter (HOSPITAL_COMMUNITY): Payer: Self-pay | Admitting: Gastroenterology

## 2023-07-27 DIAGNOSIS — I4891 Unspecified atrial fibrillation: Secondary | ICD-10-CM

## 2023-07-27 DIAGNOSIS — D122 Benign neoplasm of ascending colon: Secondary | ICD-10-CM

## 2023-07-27 DIAGNOSIS — K635 Polyp of colon: Secondary | ICD-10-CM | POA: Diagnosis not present

## 2023-07-27 DIAGNOSIS — Z1211 Encounter for screening for malignant neoplasm of colon: Secondary | ICD-10-CM

## 2023-07-27 DIAGNOSIS — D125 Benign neoplasm of sigmoid colon: Secondary | ICD-10-CM | POA: Insufficient documentation

## 2023-07-27 DIAGNOSIS — K648 Other hemorrhoids: Secondary | ICD-10-CM

## 2023-07-27 HISTORY — PX: COLONOSCOPY: SHX5424

## 2023-07-27 LAB — HM COLONOSCOPY

## 2023-07-27 SURGERY — COLONOSCOPY
Anesthesia: General

## 2023-07-27 MED ORDER — LACTATED RINGERS IV SOLN: INTRAVENOUS | Status: DC | PRN

## 2023-07-27 MED ORDER — EPHEDRINE 5 MG/ML INJ
INTRAVENOUS | Status: AC
  Filled 2023-07-27: qty 5

## 2023-07-27 MED ORDER — PROPOFOL 10 MG/ML IV BOLUS
INTRAVENOUS | Status: DC | PRN
  Administered 2023-07-27: 100 mg via INTRAVENOUS

## 2023-07-27 MED ORDER — LIDOCAINE 2% (20 MG/ML) 5 ML SYRINGE
INTRAMUSCULAR | Status: DC | PRN
  Administered 2023-07-27: 100 mg via INTRAVENOUS

## 2023-07-27 MED ORDER — PHENYLEPHRINE 80 MCG/ML (10ML) SYRINGE FOR IV PUSH (FOR BLOOD PRESSURE SUPPORT)
PREFILLED_SYRINGE | INTRAVENOUS | Status: AC
  Filled 2023-07-27: qty 10

## 2023-07-27 MED ORDER — PROPOFOL 500 MG/50ML IV EMUL
INTRAVENOUS | Status: DC | PRN
  Administered 2023-07-27: 150 ug/kg/min via INTRAVENOUS

## 2023-07-27 MED ORDER — PHENYLEPHRINE 80 MCG/ML (10ML) SYRINGE FOR IV PUSH (FOR BLOOD PRESSURE SUPPORT)
PREFILLED_SYRINGE | INTRAVENOUS | Status: DC | PRN
  Administered 2023-07-27 (×3): 160 ug via INTRAVENOUS

## 2023-07-27 MED ORDER — ONDANSETRON HCL 4 MG/2ML IJ SOLN
INTRAMUSCULAR | Status: DC | PRN
  Administered 2023-07-27: 4 mg via INTRAVENOUS

## 2023-07-27 MED ORDER — EPHEDRINE SULFATE-NACL 50-0.9 MG/10ML-% IV SOSY
PREFILLED_SYRINGE | INTRAVENOUS | Status: DC | PRN
  Administered 2023-07-27: 5 mg via INTRAVENOUS

## 2023-07-27 NOTE — Anesthesia Preprocedure Evaluation (Addendum)
 Anesthesia Evaluation  Patient identified by MRN, date of birth, ID band Patient awake    Reviewed: Allergy & Precautions, H&P , NPO status , Patient's Chart, lab work & pertinent test results, reviewed documented beta blocker date and time   History of Anesthesia Complications (+) PONV  Airway Mallampati: II  TM Distance: >3 FB Neck ROM: full    Dental  (+) Dental Advisory Given, Caps Left upper front incisor capped:   Pulmonary neg pulmonary ROS   Pulmonary exam normal breath sounds clear to auscultation       Cardiovascular Normal cardiovascular exam+ dysrhythmias Atrial Fibrillation  Rhythm:regular Rate:Normal  Chest tightness with normal stress test   Neuro/Psych negative neurological ROS  negative psych ROS   GI/Hepatic negative GI ROS, Neg liver ROS,,,  Endo/Other  negative endocrine ROS    Renal/GU negative Renal ROS  negative genitourinary   Musculoskeletal   Abdominal   Peds  Hematology negative hematology ROS (+)   Anesthesia Other Findings   Reproductive/Obstetrics negative OB ROS                             Anesthesia Physical Anesthesia Plan  ASA: 3  Anesthesia Plan: General   Post-op Pain Management: Minimal or no pain anticipated   Induction: Intravenous  PONV Risk Score and Plan: Propofol infusion  Airway Management Planned: Nasal Cannula and Natural Airway  Additional Equipment: None  Intra-op Plan:   Post-operative Plan:   Informed Consent: I have reviewed the patients History and Physical, chart, labs and discussed the procedure including the risks, benefits and alternatives for the proposed anesthesia with the patient or authorized representative who has indicated his/her understanding and acceptance.     Dental Advisory Given  Plan Discussed with: CRNA  Anesthesia Plan Comments:         Anesthesia Quick Evaluation

## 2023-07-27 NOTE — H&P (Signed)
 Primary Care Physician:  Minus Amel, MD Primary Gastroenterologist:  Dr. Alita Irwin  Pre-Procedure History & Physical: HPI:  Ruth Mendoza is a 73 y.o. female is here for a colonoscopy for history of colon polyps.  Patient denies any family history of colorectal cancer.  No melena or hematochezia.  No abdominal pain or unintentional weight loss.  No change in bowel habits.  Overall feels well from a GI standpoint.  Past Medical History:  Diagnosis Date   Chest tightness    High cholesterol    Hyperlipidemia    Paroxysmal atrial flutter (HCC)    PONV (postoperative nausea and vomiting)     Past Surgical History:  Procedure Laterality Date   ABDOMINAL HYSTERECTOMY     BREAST BIOPSY     BREAST CYST EXCISION Left 30 yrs ago   BREAST SURGERY     CHOLECYSTECTOMY     COLONOSCOPY N/A 11/18/2017   Procedure: COLONOSCOPY;  Surgeon: Ruby Corporal, MD;  Location: [facility];  Service: Endoscopy;  Laterality: N/A;  930   POLYPECTOMY  11/18/2017   Procedure: POLYPECTOMY;  Surgeon: Ruby Corporal, MD;  Location: [facility];  Service: Endoscopy;;  splenic flexure polyp cs, hepatic flexure polyp cs ascending colon polyp hs, distal sigmoid colon polyp cs, rectal polyp cs   TONSILLECTOMY      Prior to Admission medications   Medication Sig Start Date End Date Taking? Authorizing Provider  levalbuterol  (XOPENEX  HFA) 45 MCG/ACT inhaler Inhale 1-2 puffs into the lungs every 6 (six) hours as needed for wheezing. 07/18/23  Yes Early Glisson, MD  Multiple Vitamins-Minerals (CENTRUM WOMEN PO) Take 1 tablet by mouth daily.   Yes [provider]  REPATHA  SURECLICK 140 MG/ML SOAJ INJECT 140 MG INTO THE SKIN EVERY 14 DAYS 07/16/23  Yes Branch, Joyceann No, MD  ezetimibe  (ZETIA ) 10 MG tablet Take 1 tablet (10 mg total) by mouth daily. 09/25/22 06/23/23  Laurann Pollock, MD  Sodium Sulfate-Mag Sulfate-KCl (SUTAB ) [phone] MG TABS As directed 06/18/23   Hargis Lias, MD    Allergies  as of 06/18/2023 - Review Complete 06/17/2023  Allergen Reaction Noted   Codeine Nausea And Vomiting 10/19/2013    Family History  Problem Relation Age of Onset   Breast cancer Paternal Grandmother    Cancer Maternal Grandmother        stomach   Heart attack Maternal Grandfather    COPD Father    Alzheimer's disease Mother    Other Mother        has place in lung   Atrial fibrillation Mother    Cancer Sister        kidney; kidney was removed   Hypertension Son    Arthritis Daughter    Colon cancer Neg Hx     Social History   Socioeconomic History   Marital status: Married    Spouse name: Not on file   Number of children: Not on file   Years of education: Not on file   Highest education level: Not on file  Occupational History   Not on file  Tobacco Use   Smoking status: Never    Passive exposure: Never   Smokeless tobacco: Never  Vaping Use   Vaping status: Never Used  Substance and Sexual Activity   Alcohol use: Yes    Comment: occassional   Drug use: Never   Sexual activity: Yes    Birth control/protection: Surgical    Comment: hyst  Other Topics Concern  Not on file  Social History Narrative   Not on file   Social Drivers of Health   Financial Resource Strain: Low Risk  (08/02/2021)   Overall Financial Resource Strain (CARDIA)    Difficulty of Paying Living Expenses: Not very hard  Food Insecurity: No Food Insecurity (08/02/2021)   Hunger Vital Sign    Worried About Running Out of Food in the Last Year: Never true    Ran Out of Food in the Last Year: Never true  Transportation Needs: No Transportation Needs (08/02/2021)   PRAPARE - Administrator, Civil Service (Medical): No    Lack of Transportation (Non-Medical): No  Physical Activity: Insufficiently Active (08/02/2021)   Exercise Vital Sign    Days of Exercise per Week: 1 day    Minutes of Exercise per Session: 30 min  Stress: Stress Concern Present (08/02/2021)   [employer] of  Occupational Health - Occupational Stress Questionnaire    Feeling of Stress : To some extent  Social Connections: Socially Integrated (08/02/2021)   Social Connection and Isolation Panel [NHANES]    Frequency of Communication with Friends and Family: More than three times a week    Frequency of Social Gatherings with Friends and Family: Once a week    Attends Religious Services: 1 to 4 times per year    Active Member of [employer] or Organizations: Yes    Attends Banker Meetings: 1 to 4 times per year    Marital Status: Married  Catering manager Violence: Not At Risk (08/02/2021)   Humiliation, Afraid, Rape, and Kick questionnaire    Fear of Current or Ex-Partner: No    Emotionally Abused: No    Physically Abused: No    Sexually Abused: No    Review of Systems: See HPI, otherwise negative ROS  Physical Exam: Vital signs in last 24 hours: Pulse Rate:  [72] 72 (04/28 1029) Resp:  [16] 16 (04/28 1029) BP: (120)/(81) 120/81 (04/28 1029) SpO2:  [98 %] 98 % (04/28 1029)   General:   Alert,  Well-developed, well-nourished, pleasant and cooperative in NAD Head:  Normocephalic and atraumatic. Eyes:  Sclera clear, no icterus.   Conjunctiva pink. Ears:  Normal auditory acuity. Nose:  No deformity, discharge,  or lesions. Msk:  Symmetrical without gross deformities. Normal posture. Extremities:  Without clubbing or edema. Neurologic:  Alert and  oriented x4;  grossly normal neurologically. Skin:  Intact without significant lesions or rashes. Psych:  Alert and cooperative. Normal mood and affect.  Impression/Plan:   Ruth Mendoza is a 73 y.o. female is here for a colonoscopy for history of colon polyps  The risks of the procedure including infection, bleed, or perforation as well as benefits, limitations, alternatives and imponderables have been reviewed with the patient. Questions have been answered. All parties agreeable.

## 2023-07-27 NOTE — Transfer of Care (Signed)
 Immediate Anesthesia Transfer of Care Note  Patient: Ruth Mendoza  Procedure(s) Performed: COLONOSCOPY  Patient Location: Endoscopy Unit  Anesthesia Type:General  Level of Consciousness: drowsy  Airway & Oxygen Therapy: Patient Spontanous Breathing  Post-op Assessment: Report given to RN and Post -op Vital signs reviewed and stable  Post vital signs: Reviewed and stable  Last Vitals:  Vitals Value Taken Time  BP 100/56 07/27/23 1137  Temp 36.4 C 07/27/23 1137  Pulse 56 07/27/23 1137  Resp 16 07/27/23 1137  SpO2 95 % 07/27/23 1137    Last Pain:  Vitals:   07/27/23 1137  TempSrc: Oral  PainSc: 0-No pain         Complications: No notable events documented.

## 2023-07-27 NOTE — Anesthesia Postprocedure Evaluation (Signed)
 Anesthesia Post Note  Patient: Ruth Mendoza  Procedure(s) Performed: COLONOSCOPY  Patient location during evaluation: PACU Anesthesia Type: General Level of consciousness: awake and alert Pain management: pain level controlled Vital Signs Assessment: post-procedure vital signs reviewed and stable Respiratory status: spontaneous breathing, nonlabored ventilation, respiratory function stable and patient connected to nasal cannula oxygen Cardiovascular status: blood pressure returned to baseline and stable Postop Assessment: no apparent nausea or vomiting Anesthetic complications: no   There were no known notable events for this encounter.   Last Vitals:  Vitals:   07/27/23 1029 07/27/23 1137  BP: 120/81 (!) 100/56  Pulse: 72 (!) 56  Resp: 16 16  Temp:  36.4 C  SpO2: 98% 95%    Last Pain:  Vitals:   07/27/23 1137  TempSrc: Oral  PainSc: 0-No pain                 Zarie Kosiba L Ieisha Gao

## 2023-07-27 NOTE — Op Note (Signed)
 [facility] Patient Name: Ruth Mendoza Procedure Date: 07/27/2023 10:47 AM MRN: [id number] Date of Birth: 1950-06-20 Attending MD: Terril Fetters , MD, [id number] CSN: [id number] Age: 73 Admit Type: Outpatient Procedure:                Colonoscopy Indications:              High risk colon cancer surveillance: Personal                            history of colonic polyps, High risk colon cancer                            surveillance: Personal history of adenoma (10 mm or                            greater in size) Providers:                Terril Fetters, MD, Crystal Page, Kristine L. Roberta Chin, Technician Referring MD:              Medicines:                Monitored Anesthesia Care Complications:            No immediate complications. Estimated Blood Loss:     Estimated blood loss: none. Procedure:                Pre-Anesthesia Assessment:                           - Prior to the procedure, a History and Physical                            was performed, and patient medications and                            allergies were reviewed. The patient's tolerance of                            previous anesthesia was also reviewed. The risks                            and benefits of the procedure and the sedation                            options and risks were discussed with the patient.                            All questions were answered, and informed consent                            was obtained. Prior Anticoagulants: The patient has  taken no anticoagulant or antiplatelet agents. ASA                            Grade Assessment: II - A patient with mild systemic                            disease. After reviewing the risks and benefits,                            the patient was deemed in satisfactory condition to                            undergo the procedure.                           After obtaining informed  consent, the colonoscope                            was passed under direct vision. Throughout the                            procedure, the patient's blood pressure, pulse, and                            oxygen saturations were monitored continuously. The                            [id number]) scope was introduced through the                            anus and advanced to the the cecum, identified by                            appendiceal orifice and ileocecal valve. The                            ileocecal valve, appendiceal orifice, and rectum                            were photographed. Scope In: 11:07:54 AM Scope Out: 11:34:14 AM Scope Withdrawal Time: 0 hours 16 minutes 14 seconds  Total Procedure Duration: 0 hours 26 minutes 20 seconds  Findings:      The perianal and digital rectal examinations were normal.      Two sessile polyps were found in the sigmoid colon and ascending colon.       The polyps were 4 to 6 mm in size. These polyps were removed with a cold       snare. Resection and retrieval were complete.      Non-bleeding internal hemorrhoids were found during retroflexion. The       hemorrhoids were small. Impression:               - Two 4 to 6 mm polyps in the sigmoid colon and in  the ascending colon, removed with a cold snare.                            Resected and retrieved.                           - Non-bleeding internal hemorrhoids. Moderate Sedation:      Per Anesthesia Care Recommendation:           - Patient has a contact number available for                            emergencies. The signs and symptoms of potential                            delayed complications were discussed with the                            patient. Return to normal activities tomorrow.                            Written discharge instructions were provided to the                            patient.                           - Resume previous  diet.                           - Continue present medications.                           - Await pathology results.                           - Repeat colonoscopy in 5 years for surveillance.                           - Return to primary care physician as previously                            scheduled. Procedure Code(s):        --- Professional ---                           [phone], Colonoscopy, flexible; with removal of                            tumor(s), polyp(s), or other lesion(s) by snare                            technique Diagnosis Code(s):        --- Professional ---                           D12.5, Benign neoplasm of sigmoid colon  D12.2, Benign neoplasm of ascending colon                           K64.8, Other hemorrhoids                           Z86.010, Personal history of colonic polyps CPT copyright 2022 American Medical Association. All rights reserved. The codes documented in this report are preliminary and upon coder review may  be revised to meet current compliance requirements. Terril Fetters, MD Terril Fetters, MD 07/27/2023 11:38:08 AM This report has been signed electronically. Number of Addenda: 0

## 2023-07-27 NOTE — Anesthesia Procedure Notes (Addendum)
 Date/Time: 07/27/2023 11:01 AM  Performed by: Sherwin Donate, CRNAPre-anesthesia Checklist: Patient identified, Emergency Drugs available, Suction available and Patient being monitored Patient Re-evaluated:Patient Re-evaluated prior to induction Oxygen Delivery Method: Nasal cannula Induction Type: IV induction Placement Confirmation: positive ETCO2

## 2023-07-27 NOTE — Discharge Instructions (Signed)

## 2023-07-28 ENCOUNTER — Encounter (HOSPITAL_COMMUNITY): Payer: Self-pay | Admitting: Gastroenterology

## 2023-07-28 LAB — SURGICAL PATHOLOGY

## 2023-07-29 NOTE — Progress Notes (Signed)
 I reviewed the pathology results. Ann, can you send her a letter with the findings as described below please? Repeat colonoscopy in 5 years  Thanks,  Azia Toutant Faizan Ailey Wessling, MD Gastroenterology and Hepatology [facility]  ---------------------------------------------------------------------------------------------  [facility] [address], [address], [place], [place] [address] Phone:  [phone]   07/29/23 Selene Dais, [place]   Dear Ruth Mendoza,  I am writing to inform you that the biopsies taken during your recent endoscopic examination showed:  A. COLON, ASCENDING/SIGMOID, POLYPECTOMY:       Fragments of sessile serrated polyp without cytologic dysplasia.    What does this mean?    You had a total of 2 polyps removed. The pathology came back as "sessile serrated" polyp. These findings are NOT cancer, but had the polyps remained in your colon, they could have turn into cancer.  Given these findings, it is recommended that your next colonoscopy be performed in 5 years.  Also I value your feedback , so if you get a survey , please take the time to fill it out and thank you for choosing [facility]  Please call us  at [phone] if you have persistent problems or have questions about your condition that have not been fully answered at this time.  Sincerely,  Angalina Ante Faizan Tabria Steines, MD Gastroenterology and Hepatology

## 2023-08-03 DIAGNOSIS — R0602 Shortness of breath: Secondary | ICD-10-CM | POA: Diagnosis not present

## 2023-08-03 DIAGNOSIS — D6869 Other thrombophilia: Secondary | ICD-10-CM | POA: Diagnosis not present

## 2023-08-03 DIAGNOSIS — I4891 Unspecified atrial fibrillation: Secondary | ICD-10-CM | POA: Diagnosis not present

## 2023-08-03 DIAGNOSIS — N182 Chronic kidney disease, stage 2 (mild): Secondary | ICD-10-CM | POA: Diagnosis not present

## 2023-08-03 DIAGNOSIS — E785 Hyperlipidemia, unspecified: Secondary | ICD-10-CM | POA: Diagnosis not present

## 2023-08-03 DIAGNOSIS — J309 Allergic rhinitis, unspecified: Secondary | ICD-10-CM | POA: Diagnosis not present

## 2023-08-03 DIAGNOSIS — I4892 Unspecified atrial flutter: Secondary | ICD-10-CM | POA: Diagnosis not present

## 2023-08-03 DIAGNOSIS — I471 Supraventricular tachycardia, unspecified: Secondary | ICD-10-CM | POA: Diagnosis not present

## 2023-08-03 DIAGNOSIS — R001 Bradycardia, unspecified: Secondary | ICD-10-CM | POA: Diagnosis not present

## 2023-08-04 ENCOUNTER — Encounter (INDEPENDENT_AMBULATORY_CARE_PROVIDER_SITE_OTHER): Payer: Self-pay | Admitting: *Deleted

## 2023-08-07 DIAGNOSIS — E6609 Other obesity due to excess calories: Secondary | ICD-10-CM | POA: Diagnosis not present

## 2023-08-07 DIAGNOSIS — J45909 Unspecified asthma, uncomplicated: Secondary | ICD-10-CM | POA: Diagnosis not present

## 2023-08-07 DIAGNOSIS — I480 Paroxysmal atrial fibrillation: Secondary | ICD-10-CM | POA: Diagnosis not present

## 2023-08-07 DIAGNOSIS — Z6831 Body mass index (BMI) 31.0-31.9, adult: Secondary | ICD-10-CM | POA: Diagnosis not present

## 2023-08-16 NOTE — Progress Notes (Signed)
 Ruth Mendoza, female    DOB: Nov 01, 1950    MRN: [id number]   Brief patient profile:  68   yowf  never smoker works stripping furnature x decades/ heavy fumes/ referred to pulmonary clinic in [place]  08/18/2023 by  for cough p viral URI 1st week in April 2025   Had covid 2020 not hospitalized but ever since "catches everything going around" about 3 x per year this episode early April 2025 - always starts in head and works its way down. Best response is to prednisone / neg resp to symbicort though.   Pt not previously seen by PCCM service.     History of Present Illness  08/18/2023  Pulmonary/ 1st office eval/ Dashea Mcmullan / [place] [facility]  Chief Complaint  Patient presents with   Establish Care   Asthma   Dyspnea:  only "when over do it" / no better with saba or symbicort  Cough: yellow mucus when head hits pillow so sits up in recliner for up to an hour q night and clears first thing am with anterior chest like mucus is stuck there  Sleep: flat bed one pillow  SABA use: not using  02: none     No obvious day to day or daytime pattern/variability or assoc excess/ purulent sputum or mucus plugs or hemoptysis or cp or chest tightness, subjective wheeze or overt sinus or hb symptoms.    Also denies any obvious fluctuation of symptoms with weather or environmental changes or other aggravating or alleviating factors except as outlined above   No unusual exposure hx or h/o childhood pna/ asthma or knowledge of premature birth.  Current Allergies, Complete Past Medical History, Past Surgical History, Family History, and Social History were reviewed in [employer] record.  ROS  The following are not active complaints unless bolded Hoarseness, sore throat, dysphagia, dental problems, itching, sneezing,  nasal congestion or discharge of excess mucus or purulent secretions, ear ache,   fever, chills, sweats, unintended wt loss or wt gain, classically pleuritic or  exertional cp,  orthopnea pnd or arm/hand swelling  or leg swelling, presyncope, palpitations, abdominal pain, anorexia, nausea, vomiting, diarrhea  or change in bowel habits or change in bladder habits, change in stools or change in urine, dysuria, hematuria,  rash, arthralgias, visual complaints, headache, numbness, weakness or ataxia or problems with walking or coordination,  change in mood or  memory.            Outpatient Medications Prior to Visit  Medication Sig Dispense Refill   cetirizine  (ZYRTEC ) 5 MG tablet Take 5 mg by mouth daily.     levalbuterol  (XOPENEX  HFA) 45 MCG/ACT inhaler Inhale 1-2 puffs into the lungs every 6 (six) hours as needed for wheezing. 1 each 12   Multiple Vitamins-Minerals (CENTRUM WOMEN PO) Take 1 tablet by mouth daily.     REPATHA  SURECLICK 140 MG/ML SOAJ INJECT 140 MG INTO THE SKIN EVERY 14 DAYS 6 mL 0   albuterol  (PROVENTIL ) 2 MG tablet Take 2 mg by mouth 3 (three) times daily. (Patient not taking: Reported on 08/18/2023)     ezetimibe  (ZETIA ) 10 MG tablet Take 1 tablet (10 mg total) by mouth daily. 90 tablet 3   SYMBICORT 160-4.5 MCG/ACT inhaler Inhale 2 puffs into the lungs. (Patient not taking: Reported on 08/18/2023)     No facility-administered medications prior to visit.    Past Medical History:  Diagnosis Date   Chest tightness    High cholesterol  Hyperlipidemia    Paroxysmal atrial flutter (HCC)    PONV (postoperative nausea and vomiting)       Objective:     BP 111/71 (BP Location: Left Arm)   Pulse 81   Ht 5\' 9"  (1.753 m)   Wt 209 lb 6.4 oz (95 kg)   SpO2 94% Comment: RA  BMI 30.92 kg/m   SpO2: 94 % (RA) pleasant amb wf nad   HEENT : Oropharynx  clear     Nasal turbinates mild edema/ no polyps or purulence.   NECK :  without  apparent JVD/ palpable Nodes/TM    LUNGS: no acc muscle use,  Nl contour chest which is clear to A and P bilaterally without cough on insp or exp maneuvers   CV:  RRR  no s3 or murmur or increase  in P2, and no edema   ABD:  soft and nontender   MS:  Gait nl   ext warm without deformities Or obvious joint restrictions  calf tenderness, cyanosis or clubbing    SKIN: warm and dry without lesions    NEURO:  alert, approp, nl sensorium with  no motor or cerebellar deficits apparent.     I personally reviewed images and agree with radiology impression as follows:   Chest CTa chest    07/18/23   Trachea and esophagus are unremarkable. No thoracic adenopathy.  Lungs/Pleura: No focal consolidation, pleural effusion, or pneumothorax.       Assessment   Upper airway cough syndrome Onset was April 2025  - Allergy screen 08/18/2023 >  Eos 0. /  IgE   - 08/18/2023 rx augmentin / 1st gen H1 blockers per guidelines / gerd rx   Upper airway cough syndrome (previously labeled PNDS),  is so named because it's frequently impossible to sort out how much is  CR/sinusitis with freq throat clearing (which can be related to primary GERD)   vs  causing  secondary (" extra esophageal")  GERD from wide swings in gastric pressure that occur with throat clearing, often  promoting self use of mint and menthol lozenges that reduce the lower esophageal sphincter tone and exacerbate the problem further in a cyclical fashion.   These are the same pts (now being labeled as having "irritable larynx syndrome" by some cough centers) who not infrequently have a history of having failed to tolerate ace inhibitors,  dry powder inhalers or biphosphonates or report having atypical/extraesophageal reflux symptoms that don't respond to standard doses of PPI  and are easily confused as having aecopd or asthma flares by even experienced allergists/ pulmonologists (myself included).   Of the three most common causes of  Sub-acute / recurrent or chronic cough, only one (GERD)  can actually contribute to/ trigger  the other two (asthma and post nasal drip syndrome)  and perpetuate the cylce of cough.  While not intuitively  obvious, many patients with chronic low grade reflux do not cough until there is a primary insult that disturbs the protective epithelial barrier and exposes sensitive nerve endings.   This is typically viral but can due to PNDS and  either may apply here.   The point is that once this occurs, it is difficult to eliminate the cycle  using anything but a maximally effective acid suppression regimen at least in the short run, accompanied by an appropriate diet to address non acid GERD and control / eliminate the cough itself with mucinex dm if possible and pnds with 1st gen H1 blockers per guidelines  >>>  also added 6 day taper off  Prednisone  starting at 40 mg per day in case of component of Th-2 driven upper or lower airways inflammation (if cough responds short term only to relapse before return while will on full rx for uacs (as above), then that would point to allergic rhinitis/ asthma or eos bronchitis as alternative dx)    F/u in 4 weeks, sooner if needed         Each maintenance medication was reviewed in detail including emphasizing most importantly the difference between maintenance and prns and under what circumstances the prns are to be triggered using an action plan format where appropriate.  Total time for H and P, chart review, counseling, reviewing hfa device(s) and generating customized AVS unique to this office visit / same day charting = 45 min new pt eval        Vernestine Gondola, MD 08/18/2023

## 2023-08-18 ENCOUNTER — Ambulatory Visit: Payer: Self-pay | Admitting: Internal Medicine

## 2023-08-18 ENCOUNTER — Encounter: Payer: Self-pay | Admitting: Internal Medicine

## 2023-08-18 VITALS — BP 111/71 | HR 81 | Ht 69.0 in | Wt 209.4 lb

## 2023-08-18 DIAGNOSIS — R058 Other specified cough: Secondary | ICD-10-CM | POA: Diagnosis not present

## 2023-08-18 MED ORDER — FAMOTIDINE 20 MG PO TABS: ORAL_TABLET | ORAL | 11 refills | Status: AC

## 2023-08-18 MED ORDER — PREDNISONE 10 MG PO TABS
ORAL_TABLET | ORAL | 0 refills | Status: AC
Start: 2023-08-18 — End: ?

## 2023-08-18 MED ORDER — PANTOPRAZOLE SODIUM 40 MG PO TBEC: 40.0000 mg | DELAYED_RELEASE_TABLET | Freq: Every day | ORAL | 2 refills | Status: AC

## 2023-08-18 NOTE — Assessment & Plan Note (Signed)
 Onset was April 2025  - Allergy screen 08/18/2023 >  Eos 0. /  IgE   - 08/18/2023 rx augmentin / 1st gen H1 blockers per guidelines / gerd rx   Upper airway cough syndrome (previously labeled PNDS),  is so named because it's frequently impossible to sort out how much is  CR/sinusitis with freq throat clearing (which can be related to primary GERD)   vs  causing  secondary (" extra esophageal")  GERD from wide swings in gastric pressure that occur with throat clearing, often  promoting self use of mint and menthol lozenges that reduce the lower esophageal sphincter tone and exacerbate the problem further in a cyclical fashion.   These are the same pts (now being labeled as having "irritable larynx syndrome" by some cough centers) who not infrequently have a history of having failed to tolerate ace inhibitors,  dry powder inhalers or biphosphonates or report having atypical/extraesophageal reflux symptoms that don't respond to standard doses of PPI  and are easily confused as having aecopd or asthma flares by even experienced allergists/ pulmonologists (myself included).   Of the three most common causes of  Sub-acute / recurrent or chronic cough, only one (GERD)  can actually contribute to/ trigger  the other two (asthma and post nasal drip syndrome)  and perpetuate the cylce of cough.  While not intuitively obvious, many patients with chronic low grade reflux do not cough until there is a primary insult that disturbs the protective epithelial barrier and exposes sensitive nerve endings.   This is typically viral but can due to PNDS and  either may apply here.   The point is that once this occurs, it is difficult to eliminate the cycle  using anything but a maximally effective acid suppression regimen at least in the short run, accompanied by an appropriate diet to address non acid GERD and control / eliminate the cough itself with mucinex dm if possible and pnds with 1st gen H1 blockers per guidelines  >>>  also added 6 day taper off  Prednisone  starting at 40 mg per day in case of component of Th-2 driven upper or lower airways inflammation (if cough responds short term only to relapse before return while will on full rx for uacs (as above), then that would point to allergic rhinitis/ asthma or eos bronchitis as alternative dx)    F/u in 4 weeks, sooner if needed         Each maintenance medication was reviewed in detail including emphasizing most importantly the difference between maintenance and prns and under what circumstances the prns are to be triggered using an action plan format where appropriate.  Total time for H and P, chart review, counseling, reviewing hfa device(s) and generating customized AVS unique to this office visit / same day charting = 45 min new pt eval

## 2023-08-18 NOTE — Patient Instructions (Addendum)
 Prednisone  10 mg take  4 each am x 2 days,   2 each am x 2 days,  1 each am x 2 days and stop   Augmentin  875 mg take one pill twice daily  X 10 days - take at breakfast and supper with large glass of water.  It would help reduce the usual side effects (diarrhea and yeast infections) if you ate cultured yogurt at lunch.   Pantoprazole (protonix) 40 mg   Take  30-60 min before first meal of the day and Pepcid (famotidine)  20 mg after supper until return to office - this is the best way to tell whether stomach acid is contributing to your problem.    GERD (REFLUX)  is an extremely common cause of respiratory symptoms just like yours , many times with no obvious heartburn at all.    It can be treated with medication, but also with lifestyle changes including elevation of the head of your bed (ideally with 6 -8inch blocks under the headboard of your bed),  Smoking cessation, avoidance of late meals, excessive alcohol, and avoid fatty foods, chocolate, peppermint, colas, red wine, and acidic juices such as orange juice.  NO MINT OR MENTHOL PRODUCTS SO NO COUGH DROPS  USE SUGARLESS CANDY INSTEAD (Jolley ranchers or Stover's or Life Savers) or even ice chips will also do - the key is to swallow to prevent all throat clearing. NO OIL BASED VITAMINS - use powdered substitutes.  Avoid fish oil when coughing.   Stop zyrtec  and try For drainage / throat tickle try take CHLORPHENIRAMINE  4 mg  ("Allergy Relief" 4mg   at [facility] should be easiest to find in the blue box usually on bottom shelf)  take one every 4 hours as needed - extremely effective and inexpensive over the counter- may cause drowsiness so start with just a dose or two an hour before bedtime and see how you tolerate it before trying in daytime.   For cough/ congestion > mucinex dm 1200  mg every 12 hours as needed   Please remember to go to the lab department   for your tests - we will call you with the results when they are  available.     Please schedule a follow up office visit in 4 weeks, sooner if needed  with all medications /inhalers/ solutions in hand so we can verify exactly what you are taking. This includes all medications from all doctors and over the counters

## 2023-08-19 MED ORDER — AMOXICILLIN-POT CLAVULANATE 875-125 MG PO TABS: 1.0000 | ORAL_TABLET | Freq: Two times a day (BID) | ORAL | 0 refills | Status: AC

## 2023-08-19 NOTE — Addendum Note (Signed)
 Addended by: Alyse Bach on: 08/19/2023 09:14 AM   Modules accepted: Orders

## 2023-08-20 ENCOUNTER — Ambulatory Visit: Payer: Self-pay | Admitting: Internal Medicine

## 2023-08-20 LAB — CBC WITH DIFFERENTIAL/PLATELET
Basophils Absolute: 0.1 10*3/uL (ref 0.0–0.2)
Basos: 1 %
EOS (ABSOLUTE): 0.2 10*3/uL (ref 0.0–0.4)
Eos: 3 %
Hematocrit: 44.8 % (ref 34.0–46.6)
Hemoglobin: 15.1 g/dL (ref 11.1–15.9)
Immature Grans (Abs): 0 10*3/uL (ref 0.0–0.1)
Immature Granulocytes: 1 %
Lymphocytes Absolute: 1.8 10*3/uL (ref 0.7–3.1)
Lymphs: 30 %
MCH: 31.1 pg (ref 26.6–33.0)
MCHC: 33.7 g/dL (ref 31.5–35.7)
MCV: 92 fL (ref 79–97)
Monocytes Absolute: 0.5 10*3/uL (ref 0.1–0.9)
Monocytes: 8 %
Neutrophils Absolute: 3.4 10*3/uL (ref 1.4–7.0)
Neutrophils: 57 %
Platelets: 260 10*3/uL (ref 150–450)
RBC: 4.85 x10E6/uL (ref 3.77–5.28)
RDW: 12 % (ref 11.7–15.4)
WBC: 5.9 10*3/uL (ref 3.4–10.8)

## 2023-08-20 LAB — IGE: IgE (Immunoglobulin E), Serum: 12 [IU]/mL (ref 6–495)

## 2023-09-13 NOTE — Progress Notes (Signed)
 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 Best response is to prednisone / neg resp to symbicort though.   Pt not previously seen by PCCM service.     History of Present Illness  08/18/2023  Pulmonary/ 1st office eval/ [facility] / [place] [facility]  Chief Complaint  Patient presents with   Establish Care   Asthma   Dyspnea:  only when over do it / no better with saba or symbicort  Cough: yellow mucus when head hits pillow so sits up in recliner for up to an hour q night and clears first thing am with anterior chest like mucus is stuck there  Sleep: flat bed one pillow  SABA use: not using  02: none  Rec Prednisone  10 mg take  4 each am x 2 days,   2 each am x 2 days,  1 each am x 2 days and stop  Augmentin  875 mg take one pill twice daily  X 10 days  Pantoprazole  (protonix ) 40 mg   Take  30-60 min before first meal of the day and Pepcid  (famotidine )  20 mg after supper until return to office  GERD diet reviewed, bed blocks rec  Stop zyrtec  and try For drainage / throat tickle try take CHLORPHENIRAMINE  4 mg   For cough/ congestion > mucinex dm 1200  mg every 12 hours as needed Allergy screen 08/18/2023 >  Eos 0.2 /  IgE  12  Please schedule a follow up office visit in 4 weeks, sooner if needed  with all medications /inhalers/ solutions in hand   09/15/2023  f/u ov/[place] [facility]rell Umscheid re: cough p viral URI 1st week in April 2025  maint on gerd rx   did not  bring meds  Chief Complaint  Patient presents with   Follow-up  Dyspnea:  Not limited by breathing from desired activities    Cough: gone  Sleeping: flat bed one pillow s  resp cc no longer on H1  SABA use: none  02: none     No obvious day to day or daytime variability or assoc excess/ purulent sputum or mucus plugs or hemoptysis or cp or chest tightness, subjective wheeze or overt sinus or hb symptoms.    Also denies any obvious fluctuation of symptoms with weather or environmental changes or other aggravating or alleviating factors except as outlined above   No unusual exposure hx or h/o childhood pna/ asthma or knowledge of premature birth.  Current Allergies, Complete Past Medical History, Past Surgical History, Family History, and Social History were reviewed in [employer] record.  ROS  The following are not active complaints unless bolded Hoarseness, sore throat, dysphagia, dental problems, itching, sneezing,  nasal congestion or discharge of excess mucus or purulent secretions, ear ache,   fever, chills, sweats, unintended wt loss or wt gain, classically pleuritic or exertional cp,  orthopnea pnd or arm/hand swelling  or leg swelling, presyncope, palpitations, abdominal pain, anorexia, nausea, vomiting, diarrhea  or change in bowel habits or change in bladder habits,  change in stools or change in urine, dysuria, hematuria,  rash, arthralgias, visual complaints, headache, numbness, weakness or ataxia or problems with walking or coordination,  change in mood or  memory.        Current Meds  Medication Sig   ezetimibe  (ZETIA ) 10 MG tablet Take 1 tablet (10 mg total) by mouth daily.   famotidine  (PEPCID ) 20 MG tablet One after supper   Multiple Vitamins-Minerals (CENTRUM WOMEN PO) Take 1 tablet by mouth daily.   pantoprazole  (PROTONIX ) 40 MG tablet Take 1 tablet (40 mg total) by mouth daily. Take 30-60 min before first meal of the day   REPATHA  SURECLICK 140 MG/ML SOAJ INJECT 140 MG INTO THE SKIN EVERY 14 DAYS            Past Medical History:  Diagnosis Date   Chest tightness     High cholesterol    Hyperlipidemia    Paroxysmal atrial flutter (HCC)    PONV (postoperative nausea and vomiting)       Objective:     Wt Readings from Last 3 Encounters:  09/15/23 206 lb 6.4 oz (93.6 kg)  08/18/23 209 lb 6.4 oz (95 kg)  07/18/23 200 lb (90.7 kg)      Vital signs reviewed  09/15/2023  - Note at rest 02 sats  94% on RA   General appearance:    amb wf all smiles    HEENT : Oropharynx  nl      Nasal turbinates nl    NECK :  without  apparent JVD/ palpable Nodes/TM    LUNGS: no acc muscle use,  Nl contour chest which is clear to A and P bilaterally without cough on insp or exp maneuvers   CV:  RRR  no s3 or murmur or increase in P2, and no edema   ABD:  soft and nontender   MS:  Gait nl   ext warm without deformities Or obvious joint restrictions  calf tenderness, cyanosis or clubbing    SKIN: warm and dry without lesions    NEURO:  alert, approp, nl sensorium with  no motor or cerebellar deficits apparent.    I personally reviewed images and agree with radiology impression as follows:   Chest CTa chest    07/18/23   Trachea and esophagus are unremarkable. No thoracic adenopathy.  Lungs/Pleura: No focal consolidation, pleural effusion, or pneumothorax.       Assessment

## 2023-09-15 ENCOUNTER — Ambulatory Visit: Admitting: Internal Medicine

## 2023-09-15 ENCOUNTER — Encounter: Payer: Self-pay | Admitting: Internal Medicine

## 2023-09-15 VITALS — BP 116/75 | HR 87 | Ht 69.0 in | Wt 206.4 lb

## 2023-09-15 DIAGNOSIS — R058 Other specified cough: Secondary | ICD-10-CM

## 2023-09-15 NOTE — Patient Instructions (Signed)
  1) After you finish pantoprazole   2) increase pepcid  to 20 mg one twice daily after meals x one week (after breakfast and supper)  3) after one week if no cough reduce pepcid  to 20 mg after supper x one more week and  then stop  If worse cough or acid symptoms go back one step and try the next step after 2 weeks with 2 weeks between each step down.      If you are satisfied with your treatment plan,  let your doctor know and he/she can either refill your medications or you can return here when your prescription runs out.     If in any way you are not 100% satisfied,  please tell us .  If 100% better, tell your friends!  Pulmonary follow up is as needed

## 2023-09-15 NOTE — Assessment & Plan Note (Signed)
 Onset was April 2025  - Allergy screen 08/18/2023 >  Eos 0.2 /  IgE  12 - 08/18/2023 rx augmentin / 1st gen H1 blockers per guidelines / gerd rx  - 09/15/2023 cough resolved, no longer on H1 or H2 so rec wean off PPI and f/u prn   Discussed the recent press about ppi's in the context of a statistically significant (but questionably clinically relevant) increase in CRI in pts on ppi vs h2's > bottom line is the lowest dose of ppi that controls   gerd is the right dose and if that dose is zero that's fine esp since h2's are cheaper.   If flares with tapering > PCP can refer to GI Prn    F/u here is prn          Each maintenance medication was reviewed in detail including emphasizing most importantly the difference between maintenance and prns and under what circumstances the prns are to be triggered using an action plan format where appropriate.  Total time for H and P, chart review, counseling,  and generating customized AVS unique to this office visit / same day charting = 20 min summary final ov

## 2023-09-23 ENCOUNTER — Other Ambulatory Visit: Payer: Self-pay | Admitting: Cardiology

## 2023-10-05 ENCOUNTER — Other Ambulatory Visit: Payer: Self-pay | Admitting: Cardiology

## 2023-10-05 DIAGNOSIS — H25813 Combined forms of age-related cataract, bilateral: Secondary | ICD-10-CM | POA: Diagnosis not present

## 2023-10-14 ENCOUNTER — Ambulatory Visit: Admitting: Internal Medicine

## 2023-10-16 ENCOUNTER — Ambulatory Visit
Admission: EM | Admit: 2023-10-16 | Discharge: 2023-10-16 | Disposition: A | Discharge status: Home / Self Care | Attending: Nurse Practitioner | Admitting: Nurse Practitioner

## 2023-10-16 DIAGNOSIS — H6591 Unspecified nonsuppurative otitis media, right ear: Secondary | ICD-10-CM

## 2023-10-16 DIAGNOSIS — H6991 Unspecified Eustachian tube disorder, right ear: Secondary | ICD-10-CM | POA: Diagnosis not present

## 2023-10-16 MED ORDER — FLUTICASONE PROPIONATE 50 MCG/ACT NA SUSP: 2.0000 | Freq: Every day | NASAL | 0 refills | Status: AC

## 2023-10-16 MED ORDER — PREDNISONE 20 MG PO TABS: 40.0000 mg | ORAL_TABLET | Freq: Every day | ORAL | 0 refills | Status: AC

## 2023-10-16 NOTE — ED Triage Notes (Signed)
 Right and left ear pain and fullness x 2 days. Using swimmers ear drops.

## 2023-10-16 NOTE — ED Provider Notes (Signed)
 [facility]    CSN: [id number] Arrival date & time: 10/16/23  0805      History   Chief Complaint Chief Complaint  Patient presents with   Otalgia   Ear Fullness    HPI Ruth Mendoza is a 73 y.o. female.   The history is provided by the patient.   Patient presents for complaints of the right ear feeling clogged.  Patient states symptoms have been present for the past several days.  She states that symptoms started with pain and then began feeling clogged.  She states that the left ear feels fine.  She states that she has been taking over-the-counter allergy medication and use swimmers eardrops with minimal relief.  Patient denies fever, chills, nasal congestion, runny nose, ear drainage, lightheadedness, dizziness, or injury or trauma.  Past Medical History:  Diagnosis Date   Chest tightness    High cholesterol    Hyperlipidemia    Paroxysmal atrial flutter (HCC)    PONV (postoperative nausea and vomiting)     Patient Active Problem List   Diagnosis Date Noted   Upper airway cough syndrome 08/18/2023   Adenomatous polyp of ascending colon 07/27/2023   High cholesterol 08/27/2022   Epidermal cyst 08/02/2021   Special screening for malignant neoplasms, colon 07/30/2017    Past Surgical History:  Procedure Laterality Date   ABDOMINAL HYSTERECTOMY     BREAST BIOPSY     BREAST CYST EXCISION Left 30 yrs ago   BREAST SURGERY     CHOLECYSTECTOMY     COLONOSCOPY N/A 11/18/2017   Procedure: COLONOSCOPY;  Surgeon: Golda Claudis PENNER, MD;  Location: [facility];  Service: Endoscopy;  Laterality: N/A;  930   COLONOSCOPY N/A 07/27/2023   Procedure: COLONOSCOPY;  Surgeon: Cinderella Deatrice FALCON, MD;  Location: [facility];  Service: Endoscopy;  Laterality: N/A;  10:45AM;ASA 1-2   POLYPECTOMY  11/18/2017   Procedure: POLYPECTOMY;  Surgeon: Golda Claudis PENNER, MD;  Location: [facility];  Service: Endoscopy;;  splenic flexure polyp cs, hepatic flexure polyp cs  ascending colon polyp hs, distal sigmoid colon polyp cs, rectal polyp cs   TONSILLECTOMY      OB History     Gravida  3   Para  3   Term  3   Preterm      AB      Living  3      SAB      IAB      Ectopic      Multiple      Live Births  3            Home Medications    Prior to Admission medications   Medication Sig Start Date End Date Taking? Authorizing Provider  Evolocumab  (REPATHA  SURECLICK) 140 MG/ML SOAJ INJECT 140 MG INTO THE SKIN EVERY 14 DAYS 10/06/23  Yes Branch, Dorn FALCON, MD  ezetimibe  (ZETIA ) 10 MG tablet Take 1 tablet by mouth once daily 09/28/23  Yes Branch, Dorn FALCON, MD  famotidine  (PEPCID ) 20 MG tablet One after supper 08/18/23  Yes Darlean Ozell KATHEE, MD  Multiple Vitamins-Minerals (CENTRUM WOMEN PO) Take 1 tablet by mouth daily.   Yes [provider]  pantoprazole  (PROTONIX ) 40 MG tablet Take 1 tablet (40 mg total) by mouth daily. Take 30-60 min before first meal of the day 08/18/23  Yes Darlean Ozell KATHEE, MD    Family History Family History  Problem Relation Age of Onset   Breast cancer Paternal  Grandmother    Cancer Maternal Grandmother        stomach   Heart attack Maternal Grandfather    COPD Father    Alzheimer's disease Mother    Other Mother        has place in lung   Atrial fibrillation Mother    Cancer Sister        kidney; kidney was removed   Hypertension Son    Arthritis Daughter    Colon cancer Neg Hx     Social History Social History   Tobacco Use   Smoking status: Never    Passive exposure: Never   Smokeless tobacco: Never  Vaping Use   Vaping status: Never Used  Substance Use Topics   Alcohol use: Yes    Comment: occassional   Drug use: Never     Allergies   Codeine   Review of Systems Review of Systems Per HPI  Physical Exam Triage Vital Signs ED Triage Vitals  Encounter Vitals Group     BP 10/16/23 0838 101/69     Girls Systolic BP Percentile --      Girls Diastolic BP Percentile --       Boys Systolic BP Percentile --      Boys Diastolic BP Percentile --      Pulse Rate 10/16/23 0838 82     Resp 10/16/23 0838 18     Temp 10/16/23 0838 97.9 F (36.6 C)     Temp Source 10/16/23 0838 Oral     SpO2 10/16/23 0838 93 %     Weight --      Height --      Head Circumference --      Peak Flow --      Pain Score 10/16/23 0840 2     Pain Loc --      Pain Education --      Exclude from Growth Chart --    No data found.  Updated Vital Signs BP 101/69 (BP Location: Right Arm)   Pulse 82   Temp 97.9 F (36.6 C) (Oral)   Resp 18   SpO2 93%   Visual Acuity Right Eye Distance:   Left Eye Distance:   Bilateral Distance:    Right Eye Near:   Left Eye Near:    Bilateral Near:     Physical Exam Vitals and nursing note reviewed.  Constitutional:      General: She is not in acute distress.    Appearance: Normal appearance.  HENT:     Head: Normocephalic.     Right Ear: Ear canal and external ear normal. A middle ear effusion is present. There is impacted cerumen.     Left Ear: Tympanic membrane, ear canal and external ear normal.     Ears:     Comments: Ear irrigation performed of the right ear.  Post ear irrigation, complete removal of cerumen obstruction, patient with a right middle ear effusion.    Nose: Nose normal.     Mouth/Throat:     Mouth: Mucous membranes are moist.  Eyes:     Extraocular Movements: Extraocular movements intact.     Conjunctiva/sclera: Conjunctivae normal.     Pupils: Pupils are equal, round, and reactive to light.  Pulmonary:     Effort: Pulmonary effort is normal.  Musculoskeletal:     Cervical back: Normal range of motion.  Skin:    General: Skin is warm and dry.  Neurological:     General: No  focal deficit present.     Mental Status: She is alert and oriented to person, place, and time.  Psychiatric:        Mood and Affect: Mood normal.        Behavior: Behavior normal.      UC Treatments / Results  Labs (all labs  ordered are listed, but only abnormal results are displayed) Labs Reviewed - No data to display  EKG   Radiology No results found.  Procedures Procedures (including critical care time)  Medications Ordered in UC Medications - No data to display  Initial Impression / Assessment and Plan / UC Course  I have reviewed the triage vital signs and the nursing notes.  Pertinent labs & imaging results that were available during my care of the patient were reviewed by me and considered in my medical decision making (see chart for details).  Patient with right middle ear effusion.  Will treat with prednisone  40 mg for the next 5 days along with fluticasone 50 mcg nasal spray.  Patient will continue her current allergy regimen.  Supportive care recommendations were provided and discussed with the patient to include over-the-counter analgesics, warm compresses to the ear, and to avoid sticking anything inside of the ear.  Patient was advised regarding follow-up.  Patient was in agreement with this plan of care and verbalizes understanding.  All questions were answered.  Patient stable for discharge.  Final Clinical Impressions(s) / UC Diagnoses   Final diagnoses:  None   Discharge Instructions   None    ED Prescriptions   None    PDMP not reviewed this encounter.   Gilmer Etta PARAS, NP 10/16/23 [phone]

## 2023-10-16 NOTE — Discharge Instructions (Signed)
 Take medication as prescribed. Continue your current allergy medication that you are taking. You may take over-the-counter Tylenol as needed for pain, fever, or general discomfort. You may apply warm compresses to the affected ear to help with pain or discomfort. Do not stick or insert anything inside of the ear. As discussed, if symptoms fail to improve with this treatment, recommend follow-up with your primary care physician to discuss possible referral to ENT. Follow-up as needed.

## 2023-10-21 DIAGNOSIS — Z6830 Body mass index (BMI) 30.0-30.9, adult: Secondary | ICD-10-CM | POA: Diagnosis not present

## 2023-10-21 DIAGNOSIS — H905 Unspecified sensorineural hearing loss: Secondary | ICD-10-CM | POA: Diagnosis not present

## 2023-10-21 DIAGNOSIS — E6609 Other obesity due to excess calories: Secondary | ICD-10-CM | POA: Diagnosis not present

## 2023-10-21 DIAGNOSIS — H9203 Otalgia, bilateral: Secondary | ICD-10-CM | POA: Diagnosis not present

## 2023-11-01 ENCOUNTER — Encounter: Payer: Self-pay | Admitting: Cardiovascular Disease

## 2023-11-06 NOTE — Telephone Encounter (Signed)
 Mealor, Eulas BRAVO, MD  AM    11/06/23 11:53 AM Yes. This does look like AF.

## 2023-11-09 ENCOUNTER — Other Ambulatory Visit: Payer: Self-pay | Admitting: Family Medicine

## 2023-11-09 DIAGNOSIS — Z1231 Encounter for screening mammogram for malignant neoplasm of breast: Secondary | ICD-10-CM

## 2023-11-10 IMAGING — DX DG CHEST 2V
2 series · 2 of 2 positions shown · non-contrast
Comparison: Chest x-ray 09/19/2013.

CLINICAL DATA: 70-year-old female with history of fever and cough
for the past 2 days.

EXAM:
CHEST - 2 VIEW

[chest pa]
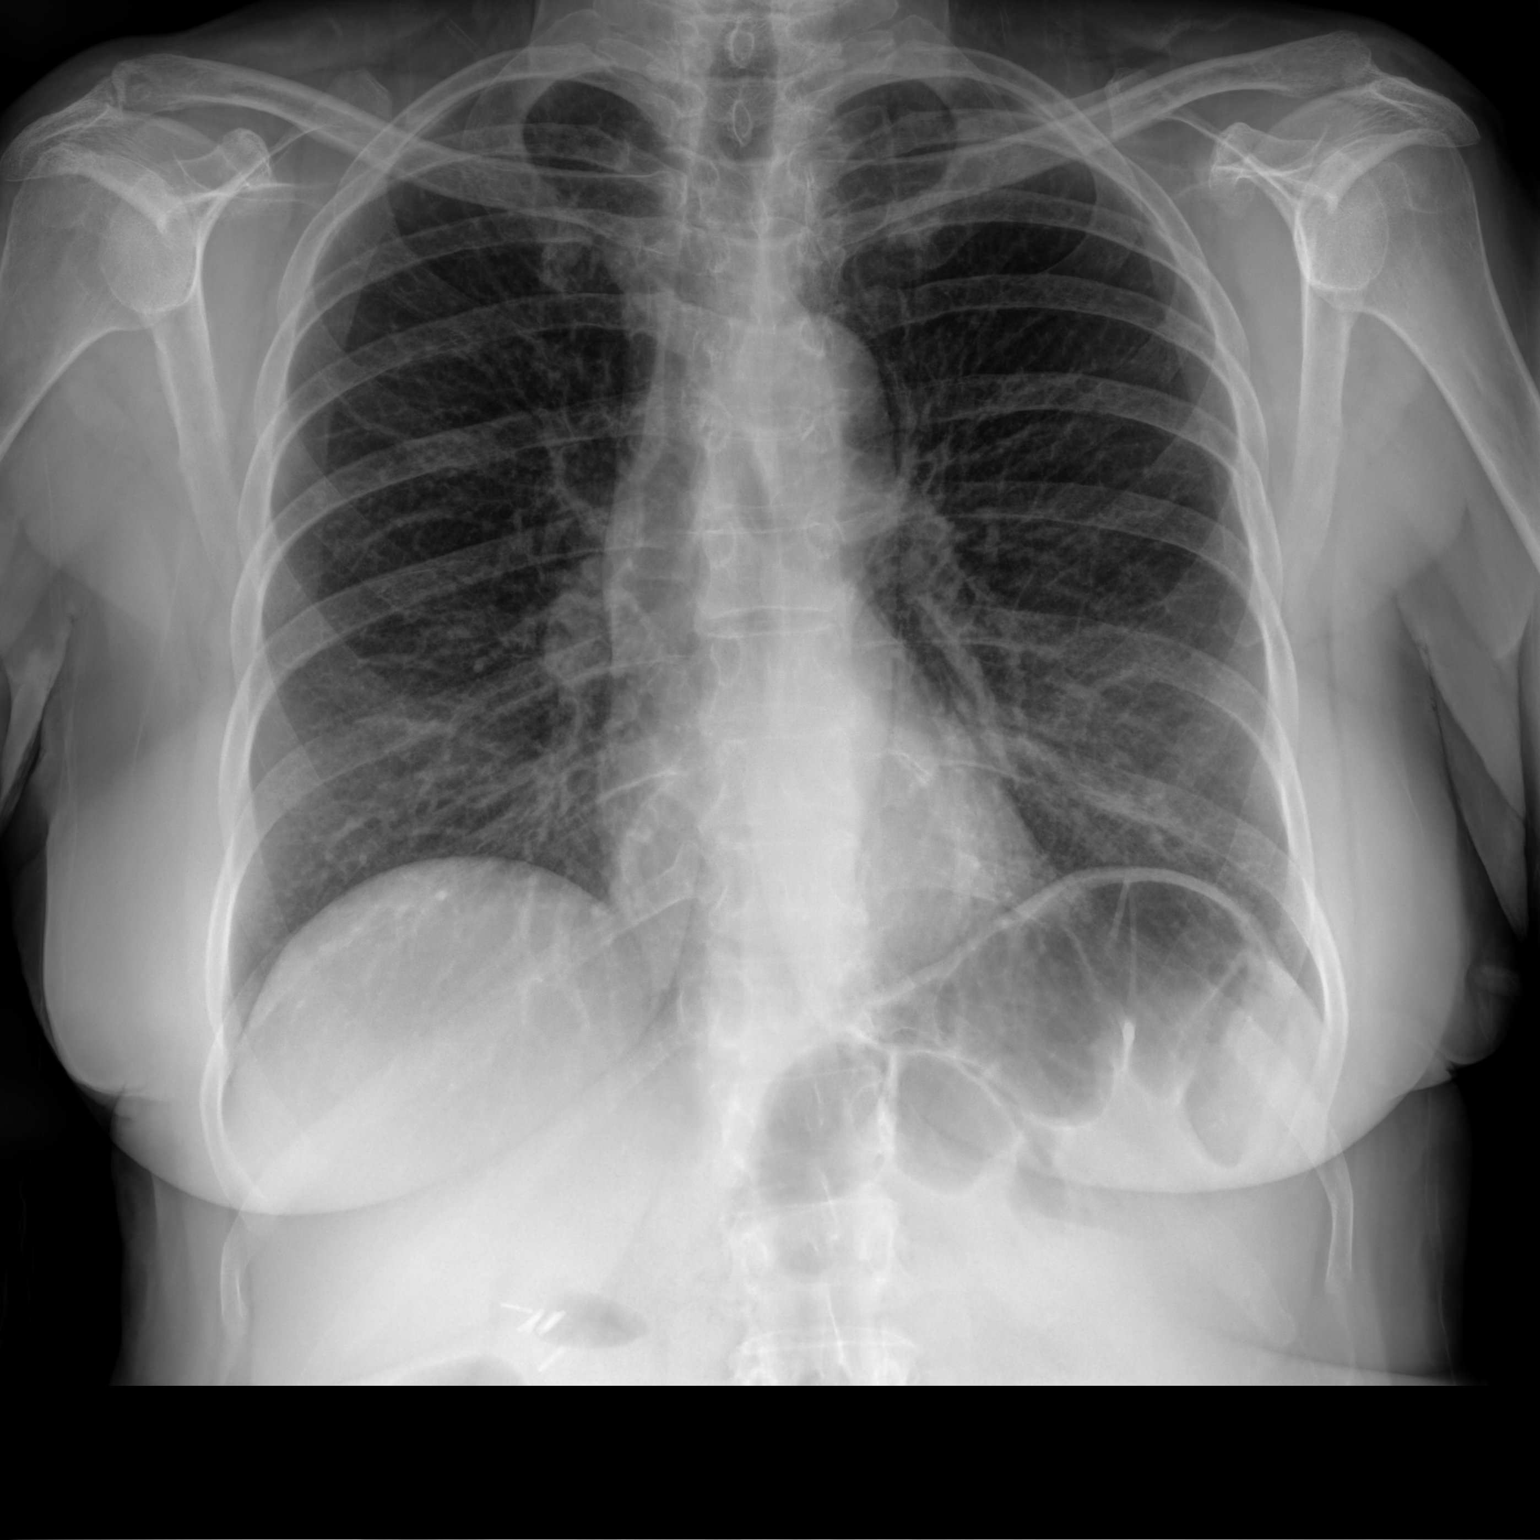

[chest lat]
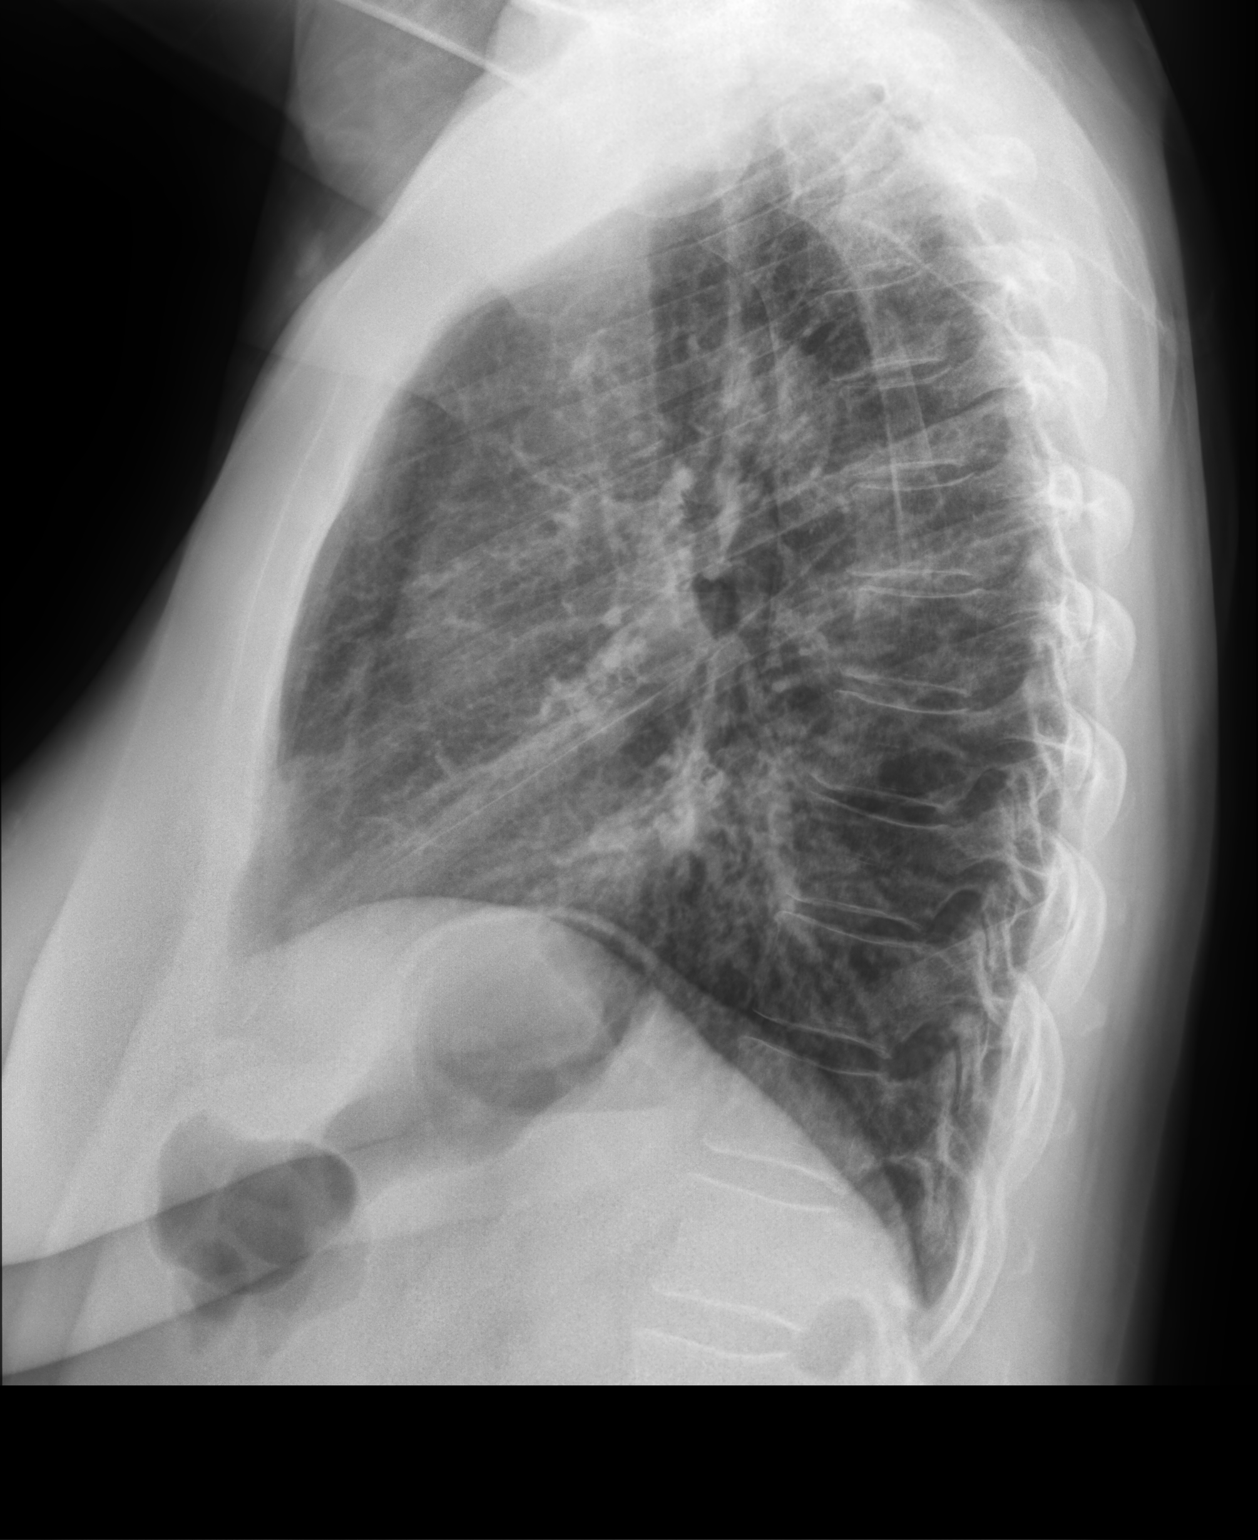

[2 of 2 positions shown; findings below may reference images not displayed]

FINDINGS: Ill-defined opacity projecting over the anterior aspect of the lower
lobes on the lateral view, not well localized on the frontal
projection, concerning for bronchopneumonia. No pleural effusions.
No pneumothorax. No evidence of pulmonary edema. Heart size is
normal. Upper mediastinal contours are within normal limits.
Atherosclerotic calcifications in the thoracic aorta. Surgical clips
project over the right upper quadrant of the abdomen, likely from
prior cholecystectomy.
IMPRESSION: 1. Findings are concerning for bronchopneumonia in the lower lobe(s)
of the lungs, not well localized on the frontal projection. Followup
PA and lateral chest X-ray is recommended in 3-4 weeks following
trial of antibiotic therapy to ensure resolution and exclude
underlying malignancy.

## 2023-11-18 DIAGNOSIS — H903 Sensorineural hearing loss, bilateral: Secondary | ICD-10-CM | POA: Diagnosis not present

## 2023-11-18 DIAGNOSIS — H9121 Sudden idiopathic hearing loss, right ear: Secondary | ICD-10-CM | POA: Diagnosis not present

## 2023-11-24 ENCOUNTER — Ambulatory Visit
Admission: RE | Admit: 2023-11-24 | Discharge: 2023-11-24 | Disposition: A | Discharge status: Home / Self Care | Source: Ambulatory Visit | Attending: Family Medicine | Admitting: Family Medicine

## 2023-11-24 DIAGNOSIS — Z1231 Encounter for screening mammogram for malignant neoplasm of breast: Secondary | ICD-10-CM

## 2023-12-03 IMAGING — US US EXTREM  UP VENOUS*L*
1 series · 13 of 24 positions shown · non-contrast
Comparison: None.

CLINICAL DATA: Erythema palpable nodule proximal to the left AC
concern for possible DVT.



[Series 1: us venous img upper uni left (dvt) · portal-venous · 13 of 46 slices shown]
[im 1/46]
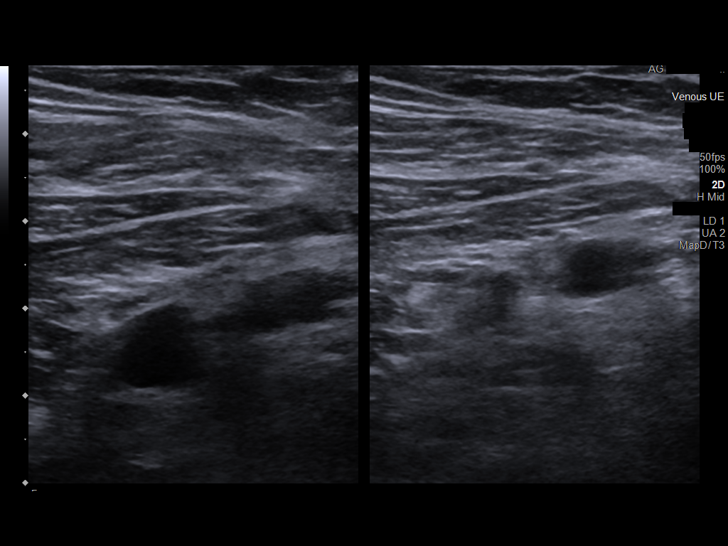
[im 4/46]
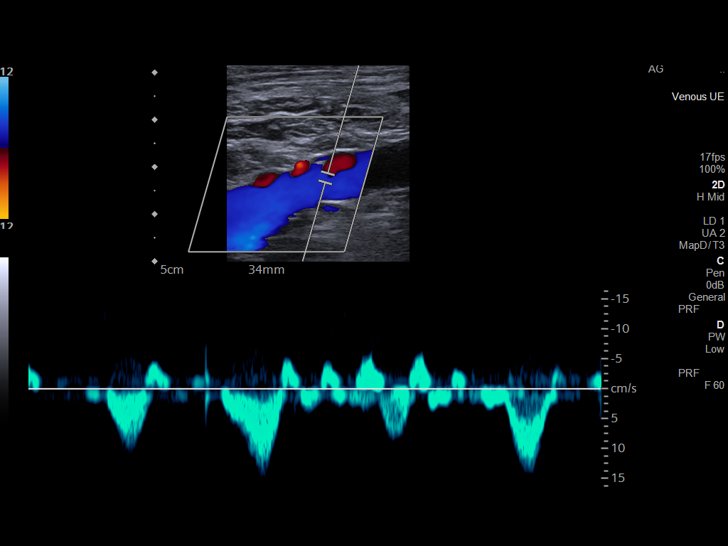
[im 8/46]
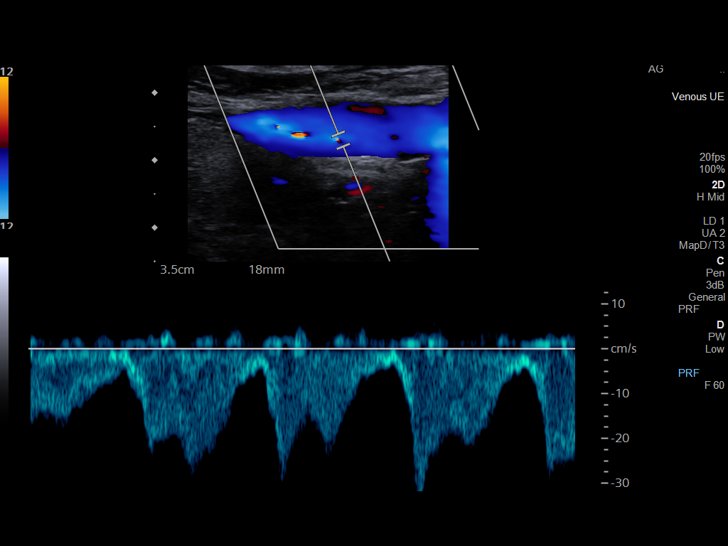
[im 12/46]
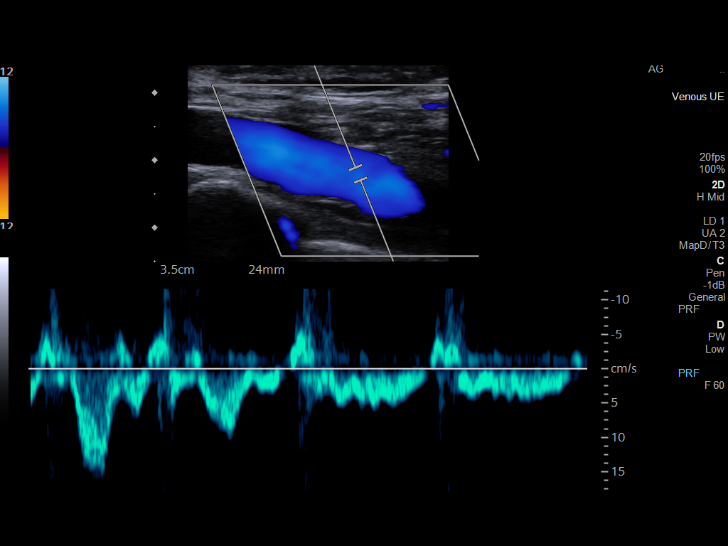
[im 16/46]
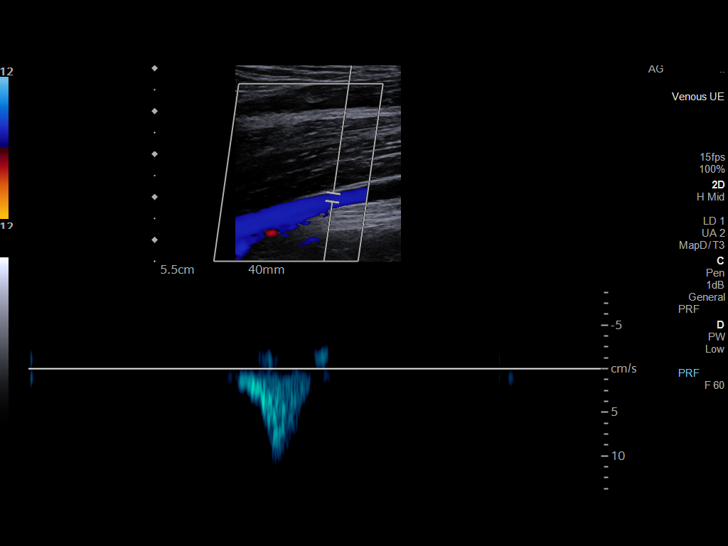
[im 20/46]
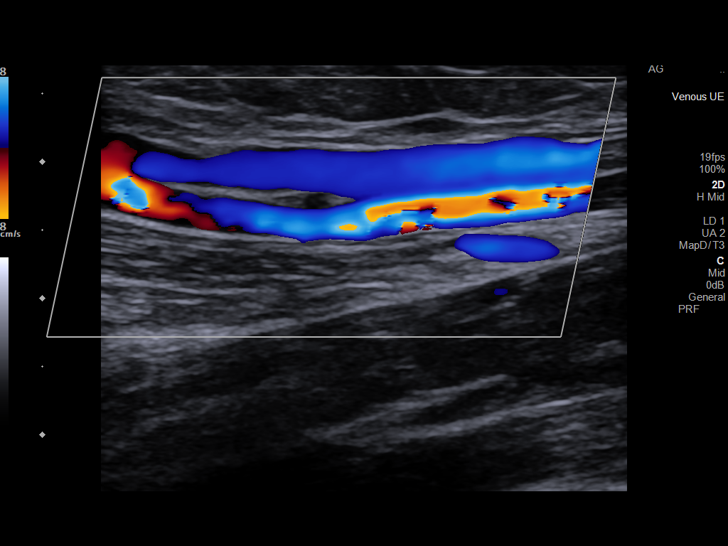
[im 24/46]
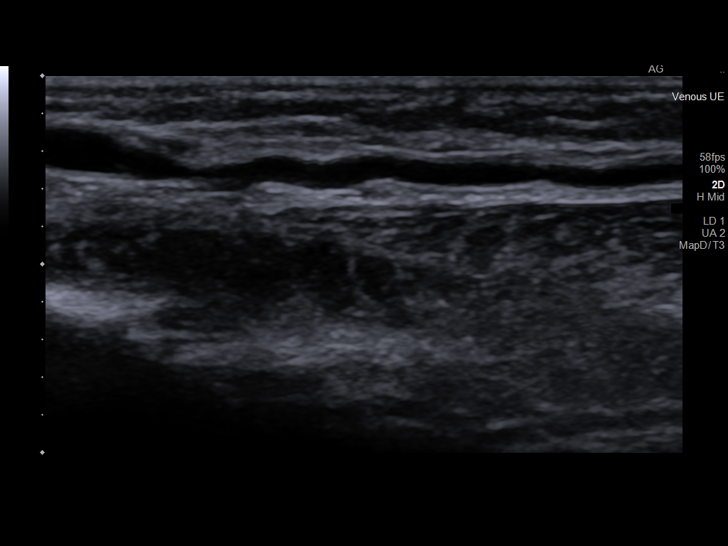
[im 26/46]
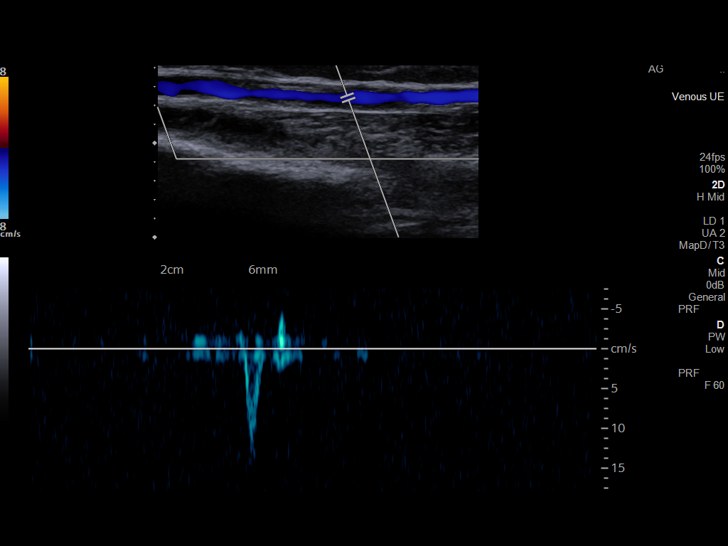
[im 30/46]
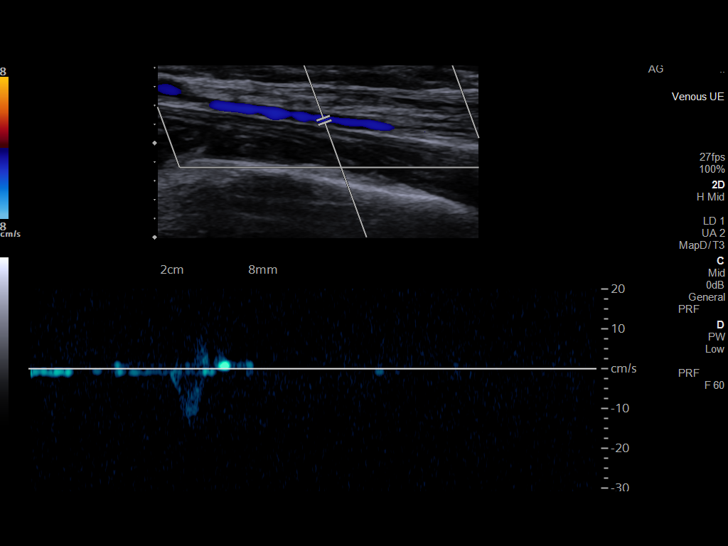
[im 34/46]
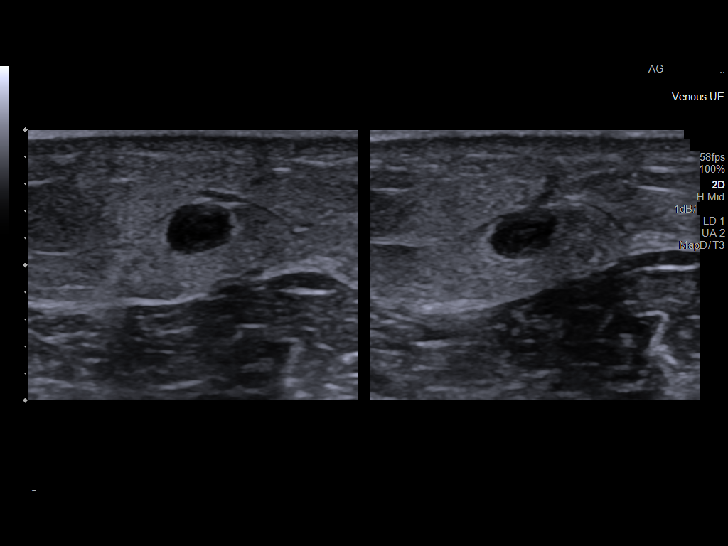
[im 38/46]
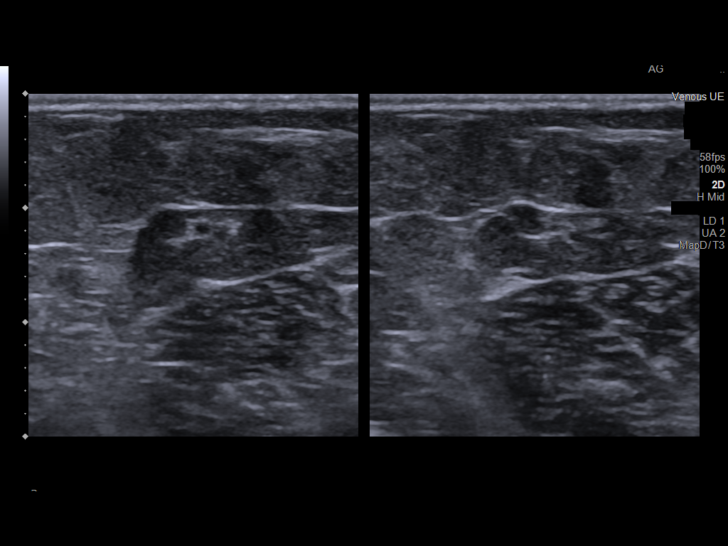
[im 42/46]
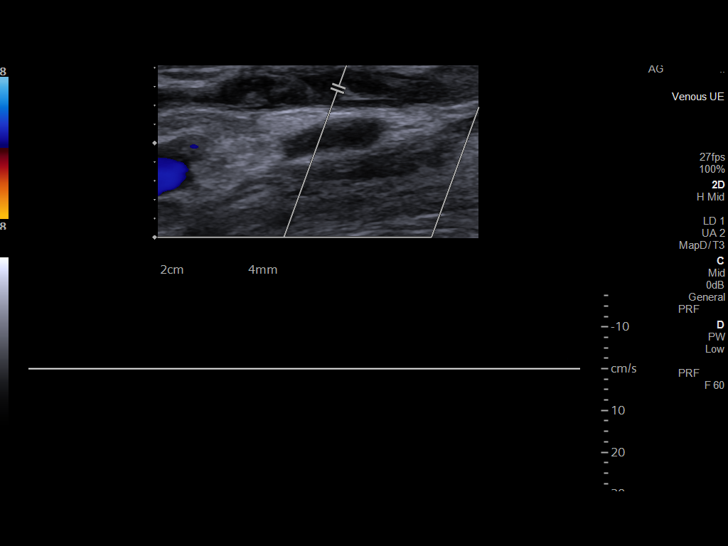
[im 46/46]
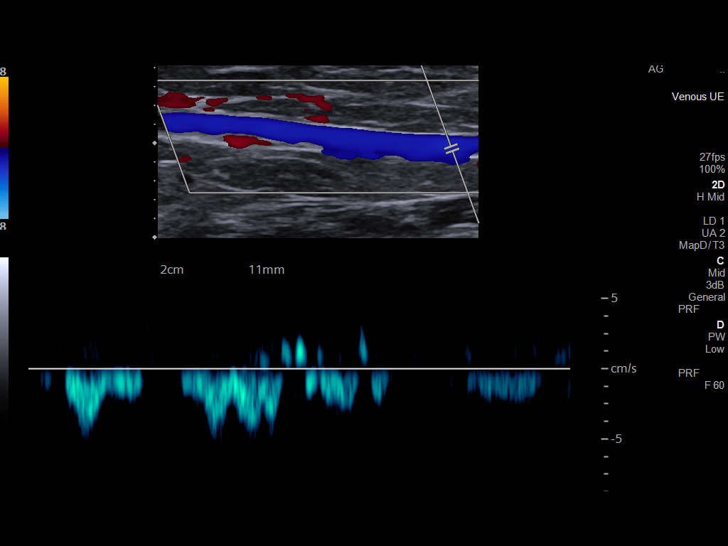

[13 of 24 positions shown; findings below may reference images not displayed]

FINDINGS: Contralateral Subclavian Vein: Respiratory phasicity is normal and
symmetric with the symptomatic side. No evidence of thrombus. Normal
compressibility.

Internal Jugular Vein: No evidence of thrombus. Normal
compressibility, respiratory phasicity and response to augmentation.

Subclavian Vein: No evidence of thrombus. Normal compressibility,
respiratory phasicity and response to augmentation.

Axillary Vein: No evidence of thrombus. Normal compressibility,
respiratory phasicity and response to augmentation.

Cephalic Vein: Occlusive thrombus is visualized with no
compressibility. This correlates with the palpable abnormality.

Basilic Vein: No evidence of thrombus. Normal compressibility,
respiratory phasicity and response to augmentation.

Brachial Veins: No evidence of thrombus. Normal compressibility,
respiratory phasicity and response to augmentation.

Radial Veins: No evidence of thrombus. Normal compressibility,
respiratory phasicity and response to augmentation.

Ulnar Veins: No evidence of thrombus. Normal compressibility,
respiratory phasicity and response to augmentation.
IMPRESSION: 1. Palpable abnormality correlates with occlusive thrombus within
the left superficial cephalic vein, extending to the distal aspect
of the upper arm.
2. No evidence of deep venous thrombosis.

## 2023-12-07 DIAGNOSIS — H18413 Arcus senilis, bilateral: Secondary | ICD-10-CM | POA: Diagnosis not present

## 2023-12-07 DIAGNOSIS — H2513 Age-related nuclear cataract, bilateral: Secondary | ICD-10-CM | POA: Diagnosis not present

## 2023-12-07 DIAGNOSIS — H2511 Age-related nuclear cataract, right eye: Secondary | ICD-10-CM | POA: Diagnosis not present

## 2023-12-07 DIAGNOSIS — H25013 Cortical age-related cataract, bilateral: Secondary | ICD-10-CM | POA: Diagnosis not present

## 2023-12-07 DIAGNOSIS — H25043 Posterior subcapsular polar age-related cataract, bilateral: Secondary | ICD-10-CM | POA: Diagnosis not present

## 2023-12-09 ENCOUNTER — Telehealth: Payer: Self-pay

## 2023-12-09 NOTE — Telephone Encounter (Signed)
   Patient Name: CABRINA SHIROMA  DOB: [birth year] MRN: [id number]  Primary Cardiologist: Alvan Carrier, MD  Chart reviewed as part of pre-operative protocol coverage. Cataract extractions are recognized in guidelines as low risk surgeries that do not typically require specific preoperative testing or holding of blood thinner therapy. Therefore, given past medical history and time since last visit, based on ACC/AHA guidelines, MALAYNA NOORI would be at acceptable risk for the planned procedure without further cardiovascular testing.   I will route this recommendation to the requesting party via Epic fax function and remove from pre-op pool.  Please call with questions.  Lamarr Satterfield, NP 12/09/2023, 10:18 AM

## 2023-12-09 NOTE — Telephone Encounter (Signed)
   Pre-operative Risk Assessment    Patient Name: Ruth Mendoza  DOB: October 17, 1950 MRN: [id number]   Date of last office visit: 06/23/23 DORN ROSS, MD Date of next office visit: NONE   Request for Surgical Clearance    Procedure:  CATARACT EXTRACTION W/ INTRAOCULAR LENS IMPLANTATION OF THE RIGHT EYE FOLLOWED BY THE LEFT EYE  Date of Surgery:  Clearance 01/19/24      AND  01/26/24                          Surgeon:  NOT INDICATED Surgeon's Group or Practice Name:  [facility] Phone number:  [phone] Fax number:  [phone]   Type of Clearance Requested:   - Medical    Type of Anesthesia:  TOPICAL ANESTHESIA W/ IV MEDICATION   Additional requests/questions:    Signed, Lucie DELENA Ku   12/09/2023, 9:30 AM

## 2024-01-13 ENCOUNTER — Encounter (INDEPENDENT_AMBULATORY_CARE_PROVIDER_SITE_OTHER): Payer: Self-pay | Admitting: Gastroenterology

## 2024-03-04 ENCOUNTER — Ambulatory Visit: Admitting: Cardiovascular Disease

## 2024-05-13 ENCOUNTER — Ambulatory Visit: Admitting: Cardiovascular Disease
